# Patient Record
Sex: Male | Born: 1990
Health system: Southern US, Community
[De-identification: ages and names within clinical notes are randomized; demographics above are authoritative.]

## PROBLEM LIST (undated history)

## (undated) DIAGNOSIS — L309 Dermatitis, unspecified: Secondary | ICD-10-CM

## (undated) DIAGNOSIS — J45909 Unspecified asthma, uncomplicated: Secondary | ICD-10-CM

---

## 2014-03-22 ENCOUNTER — Emergency Department (HOSPITAL_BASED_OUTPATIENT_CLINIC_OR_DEPARTMENT_OTHER)
Admission: EM | Admit: 2014-03-22 | Discharge: 2014-03-22 | Disposition: A | Discharge status: Home / Self Care | Payer: Managed Care, Other (non HMO) | Attending: Emergency Medicine | Admitting: Emergency Medicine

## 2014-03-22 ENCOUNTER — Encounter (HOSPITAL_BASED_OUTPATIENT_CLINIC_OR_DEPARTMENT_OTHER): Payer: Self-pay

## 2014-03-22 DIAGNOSIS — K029 Dental caries, unspecified: Secondary | ICD-10-CM | POA: Insufficient documentation

## 2014-03-22 DIAGNOSIS — Z720 Tobacco use: Secondary | ICD-10-CM | POA: Diagnosis not present

## 2014-03-22 DIAGNOSIS — K088 Other specified disorders of teeth and supporting structures: Secondary | ICD-10-CM | POA: Diagnosis present

## 2014-03-22 DIAGNOSIS — J45909 Unspecified asthma, uncomplicated: Secondary | ICD-10-CM | POA: Diagnosis not present

## 2014-03-22 HISTORY — DX: Unspecified asthma, uncomplicated: J45.909

## 2014-03-22 MED ORDER — IBUPROFEN 800 MG PO TABS: 800.0000 mg | ORAL_TABLET | Freq: Three times a day (TID) | ORAL | Status: DC | PRN

## 2014-03-22 MED ORDER — PENICILLIN V POTASSIUM 250 MG PO TABS
500.0000 mg | ORAL_TABLET | Freq: Once | ORAL | Status: AC
  Administered 2014-03-22: 500 mg via ORAL
  Filled 2014-03-22: qty 2

## 2014-03-22 MED ORDER — HYDROCODONE-ACETAMINOPHEN 5-325 MG PO TABS: 1.0000 | ORAL_TABLET | Freq: Four times a day (QID) | ORAL | Status: DC | PRN

## 2014-03-22 MED ORDER — IBUPROFEN 800 MG PO TABS
800.0000 mg | ORAL_TABLET | Freq: Once | ORAL | Status: AC
  Administered 2014-03-22: 800 mg via ORAL
  Filled 2014-03-22: qty 1

## 2014-03-22 MED ORDER — PENICILLIN V POTASSIUM 500 MG PO TABS: 500.0000 mg | ORAL_TABLET | Freq: Four times a day (QID) | ORAL | Status: DC

## 2014-03-22 MED ORDER — HYDROCODONE-ACETAMINOPHEN 5-325 MG PO TABS: 2.0000 | ORAL_TABLET | Freq: Once | ORAL | Status: DC

## 2014-03-22 NOTE — ED Provider Notes (Signed)
CSN: [id number]     Arrival date & time 03/22/14  1238 History   First MD Initiated Contact with Patient 03/22/14 1300     Chief Complaint  Patient presents with  . Dental Pain     (Consider location/radiation/quality/duration/timing/severity/associated sxs/prior Treatment) Patient is a 24 y.o. male presenting with tooth pain. The history is provided by the patient.  Dental Pain Location:  Upper Upper teeth location:  15/LU 2nd molar Quality:  Aching Severity:  Moderate Onset quality:  Gradual Timing:  Constant Progression:  Worsening Chronicity:  Chronic Context: dental fracture   Associated symptoms: no fever     Past Medical History  Diagnosis Date  . Asthma    History reviewed. No pertinent past surgical history. No family history on file. History  Substance Use Topics  . Smoking status: Current Every Day Smoker  . Smokeless tobacco: Not on file  . Alcohol Use: Yes    Review of Systems  Constitutional: Negative for fever and chills.  Respiratory: Negative for cough and shortness of breath.   All other systems reviewed and are negative.     Allergies  Review of patient's allergies indicates no known allergies.  Home Medications   Prior to Admission medications   Not on File   BP 162/102 mmHg  Pulse 75  Temp(Src) 97.8 F (36.6 C) (Oral)  Resp 18  Ht 6\' 1"  (1.854 m)  Wt 205 lb (92.987 kg)  BMI 27.05 kg/m2  SpO2 99% Physical Exam  Constitutional: He is oriented to person, place, and time. He appears well-developed and well-nourished. No distress.  HENT:  Head: Normocephalic and atraumatic.  Mouth/Throat: Oropharynx is clear and moist. No oropharyngeal exudate.    Eyes: EOM are normal. Pupils are equal, round, and reactive to light.  Neck: Normal range of motion. Neck supple.  Cardiovascular: Normal rate and regular rhythm.  Exam reveals no friction rub.   No murmur heard. Pulmonary/Chest: Effort normal and breath sounds normal. No respiratory  distress. He has no wheezes. He has no rales.  Abdominal: Soft. He exhibits no distension. There is no tenderness. There is no rebound.  Musculoskeletal: Normal range of motion. He exhibits no edema.  Neurological: He is alert and oriented to person, place, and time.  Skin: No rash noted. He is not diaphoretic.  Nursing note and vitals reviewed.   ED Course  Procedures (including critical care time) Labs Review Labs Reviewed - No data to display  Imaging Review No results found.   EKG Interpretation None      MDM   Final diagnoses:  Pain due to dental caries    34M here with dental problem. Hx of dental fractures and was seeing a dentist, however he didn't f/u when dentist recommended a root canal. On exam, airway patent, no facial swelling. R upper posterior molar with a fracture. L upper posterior molar with almost entire tooth fractured off. Exquisitely tender to touch. No intraoral abscess. Patient refused dental block. Given motrin, pain meds, penicillin, dental referral.    Elwin MochaBlair Franz Svec, MD 03/22/14 1334

## 2014-03-22 NOTE — ED Notes (Signed)
Pt unable to produce driver. Vicodin withheld. Pt verbalized understanding.

## 2014-03-22 NOTE — ED Notes (Signed)
Pt advised he will be given vicodin and discharged when his ride arrives.

## 2014-03-22 NOTE — ED Notes (Signed)
C/o bilat upper toothaches-"bump" left upper gum

## 2014-03-22 NOTE — Discharge Instructions (Signed)
Dental Pain °A tooth ache may be caused by cavities (tooth decay). Cavities expose the nerve of the tooth to air and hot or cold temperatures. It may come from an infection or abscess (also called a boil or furuncle) around your tooth. It is also often caused by dental caries (tooth decay). This causes the pain you are having. °DIAGNOSIS  °Your caregiver can diagnose this problem by exam. °TREATMENT  °· If caused by an infection, it may be treated with medications which kill germs (antibiotics) and pain medications as prescribed by your caregiver. Take medications as directed. °· Only take over-the-counter or prescription medicines for pain, discomfort, or fever as directed by your caregiver. °· Whether the tooth ache today is caused by infection or dental disease, you should see your dentist as soon as possible for further care. °SEEK MEDICAL CARE IF: °The exam and treatment you received today has been provided on an emergency basis only. This is not a substitute for complete medical or dental care. If your problem worsens or new problems (symptoms) appear, and you are unable to meet with your dentist, call or return to this location. °SEEK IMMEDIATE MEDICAL CARE IF:  °· You have a fever. °· You develop redness and swelling of your face, jaw, or neck. °· You are unable to open your mouth. °· You have severe pain uncontrolled by pain medicine. °MAKE SURE YOU:  °· Understand these instructions. °· Will watch your condition. °· Will get help right away if you are not doing well or get worse. °Document Released: 01/20/2005 Document Revised: 04/14/2011 Document Reviewed: 09/08/2007 °ExitCare® Patient Information ©2015 [employer]. This information is not intended to replace advice given to you by your health care provider. Make sure you discuss any questions you have with your health care provider. ° °Emergency Department Resource Guide °1) Find a Doctor and Pay Out of Pocket °Although you won't have to find out who  is covered by your insurance plan, it is a good idea to ask around and get recommendations. You will then need to call the office and see if the doctor you have chosen will accept you as a new patient and what types of options they offer for patients who are self-pay. Some doctors offer discounts or will set up payment plans for their patients who do not have insurance, but you will need to ask so you aren't surprised when you get to your appointment. ° °2) Contact Your Local Health Department °Not all health departments have doctors that can see patients for sick visits, but many do, so it is worth a call to see if yours does. If you don't know where your local health department is, you can check in your phone book. The CDC also has a tool to help you locate your state's health department, and many state websites also have listings of all of their local health departments. ° °3) Find a Walk-in Clinic °If your illness is not likely to be very severe or complicated, you may want to try a walk in clinic. These are popping up all over the country in pharmacies, drugstores, and shopping centers. They're usually staffed by nurse practitioners or physician assistants that have been trained to treat common illnesses and complaints. They're usually fairly quick and inexpensive. However, if you have serious medical issues or chronic medical problems, these are probably not your best option. ° °No Primary Care Doctor: °- Call [facility] at  832-8000 - they can help you locate a primary   care doctor that  accepts your insurance, provides certain services, etc. °- Physician Referral Service- 1-800-533-3463 ° °Chronic Pain Problems: °Organization         Address  Phone   Notes  °[facility]  (336) 297-2271 Patients need to be referred by their primary care doctor.  ° °Medication Assistance: °Organization         Address  Phone   Notes  °[place] [facility] [address]  [address] °[place], [place] [address] (336) 641-8030 --Must be a resident of [place] °-- Must have NO insurance coverage whatsoever (no Medicaid/ Medicare, etc.) °-- The pt. MUST have a primary care doctor that directs their care regularly and follows them in the community °  °[facility]  (866) 331-1348   °[employer]  (888) 892-1162   ° °Agencies that provide inexpensive medical care: °Organization         Address  Phone   Notes  °[facility]  (336) 832-8035   °[facility]    (336) 832-7272   °[facility] [address] °[place], [place] [address] (336) 832-4777   °[facility] [address], °[place] (336) 271-4999   °[facility]    (336) 373-0678   °[facility]    (336) 272-1050   °[facility] ° [address], [place] Phone:  (336) 832-4444, Fax:  (336) 832-4440 Hours of Operation:  9 am - 6 pm, M-F.  Also accepts Medicaid/Medicare and self-pay.  °[facility] ° [address], [place] Phone: (336) 832-3150, Fax: (336) 832-3151. Hours of Operation:  8:30 am - 5:30 pm, M-F.  Also accepts Medicaid and self-pay.  °[facility] [place] [address], [place] Phone: (336) 878-6027   °[facility] [address], [place], [place] (336)723-1848, Ext. 123 Mondays & Thursdays: 7-9 AM.  First 15 patients are seen on a first come, first serve basis. °  ° °Medicaid-accepting [place] Providers: ° °Organization         Address  Phone   Notes  °[facility] [address], [place] (336) 641-2100 Also accepts self-pay patients.  °[facility] [address], [place] ° (336) 856-9996   °[facility] [address], [place] (336) 288-8857   °[facility] [address] [place] [address], [place] (336) 299-7000   °Veita Bland [address], [place]  ° (336) 373-1557 Only accepts [place] Access Medicaid patients after they have their name applied to their card.  ° °Self-Pay (no insurance) in [place]: ° °Organization         Address  Phone   Notes  °Sickle Cell Patients, [facility] [address], [place] (336) 832-1970   °[facility] [address], [place] (336) 832-4400   °[facility] ° [address], [place] (336) 992-4800   °[facility]/Dr. Osei-Bonsu ° [address] [place] [address], [place] or [address], [place] (336) 841-8500 Phone number for both [place] and [place] locations is the same.  °[facility] [address], [place] (336) 299-0000   °[facility] [address] [place] [address],  or [address] (336) 852-7530 °(336) 878-2260   °  [facility] [address], [place] (336) 350-1642, phone; (336) 294-5005, fax Sees patients 1st and 3rd Saturday of every month.  Must not qualify for public or private insurance (i.e. Medicaid, Medicare, [place] Health Choice, Veterans' Benefits) • Household income should be no more than 200% of the poverty level •The clinic cannot treat you if you are pregnant or think you are pregnant • Sexually transmitted diseases are not treated at the clinic.  ° ° °Dental Care: °Organization         Address  Phone  Notes  °[facility] [address], [place] (336) 641-6152 Accepts children up to age 21 who are enrolled in Medicaid or [place] Health Choice; pregnant women with a Medicaid card; and children who have applied for Medicaid or [place] Health Choice, but were declined, whose parents can pay a reduced fee at time of service.  °[facility] [place]  [address], [place] (336) 641-7733 Accepts children up to age 21 who are  enrolled in Medicaid or [place] Health Choice; pregnant women with a Medicaid card; and children who have applied for Medicaid or [place] Health Choice, but were declined, whose parents can pay a reduced fee at time of service.  °[facility] ° [address], [place] (336) 641-4533 Patients are seen by appointment only. Walk-ins are not accepted. [facility] will see patients 18 years of age and older. °Monday - Tuesday (8am-5pm) °Most Wednesdays (8:30-5pm) °$30 per visit, cash only  °[facility] ° [address], [place] (336) 641-4533 Patients are seen by appointment only. Walk-ins are not accepted. [facility] will see patients 18 years of age and older. °One Wednesday Evening (Monthly: Volunteer Based).  $30 per visit, cash only  °[facility]  (919) 537-3737 for adults; Children under age 4, call Graduate Pediatric Dentistry at ([phone]) [phone]. Children aged 4-14, please call (919) [phone] to request a pediatric application. ° Dental services are provided in all areas of dental care including fillings, crowns and bridges, complete and partial dentures, implants, gum treatment, root canals, and extractions. Preventive care is also provided. Treatment is provided to both adults and children. °Patients are selected via a lottery and there is often a waiting list. °  °[facility] [address], °[place] ° (336) 763-8833 [email or web address] °  °[facility] [address], [place], [place] (336)723-1848, Ext. 123 Second and Fourth Thursday of each month, opens at 6:30 AM; Clinic ends at 9 AM.  Patients are seen on a first-come first-served basis, and a limited number are seen during each clinic.  ° °[facility] ° [address], [place], [place] (336) 723-7904   Eligibility Requirements °You must have lived in [place], [place], or [place] counties for at least the last three months. °  You  cannot be eligible for state or federal sponsored healthcare insurance, including Veterans Administration, Medicaid, or Medicare. °  You generally cannot be eligible for healthcare insurance through your employer.  °  How to apply: °Eligibility screenings are held every Tuesday and Wednesday afternoon from 1:00 pm until 4:00 pm. You do not need an appointment for the interview!  °[facility] [address], [place], [place] 336-631-2330   °[facility]  336-342-8273   °[facility]  336-703-3100   °[facility]  336-570-6415   ° °Behavioral Health Resources in the Community: °Intensive Outpatient Programs °Organization         Address  Phone  Notes  °[facility] [address], [place], [place] 336-878-6098   °[facility] [address], [place], [place] 336-832-9800   °[facility] [address], [place], [place] ° 336-882-2125   °[facility] [address],  °[place], [place] 1-800-853-5163 or 336-641-4981   °Substance Abuse Resources °Organization         Address  Phone  Notes  °[facility]  336-882-2125   °[facility]  336-784-9470   °[facility]  336-285-9073   °[facility]  336-845-3988   °Residential & Outpatient Substance Abuse Program  1-800-659-3381   °Psychological Services °Organization         Address  Phone  Notes  °[facility]  336- 832-9600   °[facility]  336- 378-7881   °[facility] [address], [place] 1-800-853-5163 or 336-641-4981   ° °Mobile Crisis Teams °Organization         Address  Phone  Notes  °[facility], Mobile Crisis Care Unit  1-877-626-1772   °Assertive °[facility] ° [address] [place], [place] 336-834-9664   °Sharon DeEsch [address] °[place] 336-554-5454   ° °Self-Help/Support  Groups °Organization         Address  Phone             Notes  °[facility] - variety of support groups  336- 373-1402 Call for more information  °Narcotics Anonymous (NA), [facility] [address], °[place]  2 meetings at this location  ° °Residential Treatment Programs °Organization         Address  Phone  Notes  °[facility] [address],    °[place]  1-866-801-8205   °[facility] ° [address], [place], [place] 704-293-8524   °[facility] [address], [place] 336-845-3988 Admissions: 8am-3pm M-F  °[facility] [address]    °[place], [place] 336-841-1104   °[facility] [address], [place], [place] 336-379-7146   °[facility] [address]  °[place], [place] 336-285-9073   °[facility] - Intensive Outpatient [address], [place], [place] 336-852-3033   °[facility] [address]  °[place], [place] 1-877-615-2722 or 336-784-9470   °[facility] [address] [place], [place] 336-227-7417 Accepts Medicaid  °[facility] [address]  ° [place] 1-800-659-3381 Substance Abuse/Addiction Treatment  ° °[place] Behavioral Health Resources °Organization         Address  Phone  Notes  °[facility]  (888) 581-9988   °Julie Brannon, PhD [address] [place], [place]   (336) 349-5553 or (336) 951-0000   °[facility]   [address] °[place], [place] (336) 349-4454   °[facility] [address], [place],  (336) 342-8316 Insurance/Medicaid/sponsorship through [facility]  °[facility] [address]                                    [place],  (336) 342-8316 Therapy/tele-psych/case  °[facility]   [address]  ° [place], [place] (336) 349-2233    °Dr. Arfeen  (336) 349-4544   °[facility] [place]  [place]  [employer] [place] [facility] 1) [address], [place] °2) [address], [place] °3)  [address], [place] (336) 349-3220 °(336) 342-7768 ° °(336) 342-8140   °[place] [facility] (336) 342-1394 or (336) 342-3537 (After Hours)    ° ° ° °

## 2014-06-20 ENCOUNTER — Encounter (HOSPITAL_BASED_OUTPATIENT_CLINIC_OR_DEPARTMENT_OTHER): Payer: Self-pay | Admitting: *Deleted

## 2014-06-20 ENCOUNTER — Emergency Department (HOSPITAL_BASED_OUTPATIENT_CLINIC_OR_DEPARTMENT_OTHER)
Admission: EM | Admit: 2014-06-20 | Discharge: 2014-06-20 | Disposition: A | Discharge status: Home / Self Care | Payer: BLUE CROSS/BLUE SHIELD | Attending: Emergency Medicine | Admitting: Emergency Medicine

## 2014-06-20 DIAGNOSIS — J45909 Unspecified asthma, uncomplicated: Secondary | ICD-10-CM | POA: Insufficient documentation

## 2014-06-20 DIAGNOSIS — Z720 Tobacco use: Secondary | ICD-10-CM | POA: Diagnosis not present

## 2014-06-20 DIAGNOSIS — K0889 Other specified disorders of teeth and supporting structures: Secondary | ICD-10-CM

## 2014-06-20 DIAGNOSIS — K088 Other specified disorders of teeth and supporting structures: Secondary | ICD-10-CM | POA: Diagnosis present

## 2014-06-20 MED ORDER — NAPROXEN 375 MG PO TABS: 375.0000 mg | ORAL_TABLET | Freq: Two times a day (BID) | ORAL | Status: DC

## 2014-06-20 MED ORDER — AMOXICILLIN 500 MG PO CAPS: 500.0000 mg | ORAL_CAPSULE | Freq: Three times a day (TID) | ORAL | Status: DC

## 2014-06-20 NOTE — Discharge Instructions (Signed)
Please call your doctor for a followup appointment within 24-48 hours. When you talk to your doctor please let them know that you were seen in the emergency department and have them acquire all of your records so that they can discuss the findings with you and formulate a treatment plan to fully care for your new and ongoing problems. Please follow-up to primary care provider Please follow-up with your dentist Please take antibiotics as prescribed Please apply cool compressions for soothing purposes Please continue to monitor symptoms closely and if symptoms are to worsen or change (fever greater than 101, chills, sweating, nausea, vomiting, chest pain, shortness of breathe, difficulty breathing, weakness, numbness, tingling, worsening or changes to pain pattern, facial swelling, neck swelling, inability to swallow, bleeding, pus drainage) please report back to the Emergency Department immediately.    Dental Pain Toothache is pain in or around a tooth. It may get worse with chewing or with cold or heat.  HOME CARE  Your dentist may use a numbing medicine during treatment. If so, you may need to avoid eating until the medicine wears off. Ask your dentist about this.  Only take medicine as told by your dentist or doctor.  Avoid chewing food near the painful tooth until after all treatment is done. Ask your dentist about this. GET HELP RIGHT AWAY IF:   The problem gets worse or new problems appear.  You have a fever.  There is redness and puffiness (swelling) of the face, jaw, or neck.  You cannot open your mouth.  There is pain in the jaw.  There is very bad pain that is not helped by medicine. MAKE SURE YOU:   Understand these instructions.  Will watch your condition.  Will get help right away if you are not doing well or get worse. Document Released: 07/09/2007 Document Revised: 04/14/2011 Document Reviewed: 07/09/2007 [facility] Patient Information 2015 [place], [place]. This  information is not intended to replace advice given to you by your health care provider. Make sure you discuss any questions you have with your health care provider.  Dental Care and Dentist Visits Dental care supports good overall health. Regular dental visits can also help you avoid dental pain, bleeding, infection, and other more serious health problems in the future. It is important to keep the mouth healthy because diseases in the teeth, gums, and other oral tissues can spread to other areas of the body. Some problems, such as diabetes, heart disease, and pre-term labor have been associated with poor oral health.  See your dentist every 6 months. If you experience emergency problems such as a toothache or broken tooth, go to the dentist right away. If you see your dentist regularly, you may catch problems early. It is easier to be treated for problems in the early stages.  WHAT TO EXPECT AT A DENTIST VISIT  Your dentist will look for many common oral health problems and recommend proper treatment. At your regular dental visit, you can expect:  Gentle cleaning of the teeth and gums. This includes scraping and polishing. This helps to remove the sticky substance around the teeth and gums (plaque). Plaque forms in the mouth shortly after eating. Over time, plaque hardens on the teeth as tartar. If tartar is not removed regularly, it can cause problems. Cleaning also helps remove stains.  Periodic X-rays. These pictures of the teeth and supporting bone will help your dentist assess the health of your teeth.  Periodic fluoride treatments. Fluoride is a natural mineral shown to  help strengthen teeth. Fluoride treatmentinvolves applying a fluoride gel or varnish to the teeth. It is most commonly done in children.  Examination of the mouth, tongue, jaws, teeth, and gums to look for any oral health problems, such as:  Cavities (dental caries). This is decay on the tooth caused by plaque, sugar, and acid  in the mouth. It is best to catch a cavity when it is small.  Inflammation of the gums caused by plaque buildup (gingivitis).  Problems with the mouth or malformed or misaligned teeth.  Oral cancer or other diseases of the soft tissues or jaws. KEEP YOUR TEETH AND GUMS HEALTHY For healthy teeth and gums, follow these general guidelines as well as your dentist's specific advice:  Have your teeth professionally cleaned at the dentist every 6 months.  Brush twice daily with a fluoride toothpaste.  Floss your teeth daily.  Ask your dentist if you need fluoride supplements, treatments, or fluoride toothpaste.  Eat a healthy diet. Reduce foods and drinks with added sugar.  Avoid smoking. TREATMENT FOR ORAL HEALTH PROBLEMS If you have oral health problems, treatment varies depending on the conditions present in your teeth and gums.  Your caregiver will most likely recommend good oral hygiene at each visit.  For cavities, gingivitis, or other oral health disease, your caregiver will perform a procedure to treat the problem. This is typically done at a separate appointment. Sometimes your caregiver will refer you to another dental specialist for specific tooth problems or for surgery. SEEK IMMEDIATE DENTAL CARE IF:  You have pain, bleeding, or soreness in the gum, tooth, jaw, or mouth area.  A permanent tooth becomes loose or separated from the gum socket.  You experience a blow or injury to the mouth or jaw area. Document Released: 10/02/2010 Document Revised: 04/14/2011 Document Reviewed: 10/02/2010 [facility]Exit[facility] Patient Information 2015 [place]ExitCare, [place]LLC. This information is not intended to replace advice given to you by your health care provider. Make sure you discuss any questions you have with your health care provider.   Emergency Department Resource Guide 1) Find a Doctor and Pay Out of Pocket Although you won't have to find out who is covered by your insurance plan, it is a good  idea to ask around and get recommendations. You will then need to call the office and see if the doctor you have chosen will accept you as a new patient and what types of options they offer for patients who are self-pay. Some doctors offer discounts or will set up payment plans for their patients who do not have insurance, but you will need to ask so you aren't surprised when you get to your appointment.  2) Contact Your Local Health Department Not all health departments have doctors that can see patients for sick visits, but many do, so it is worth a call to see if yours does. If you don't know where your local health department is, you can check in your phone book. The CDC also has a tool to help you locate your state's health department, and many state websites also have listings of all of their local health departments.  3) Find a Walk-in Clinic If your illness is not likely to be very severe or complicated, you may want to try a walk in clinic. These are popping up all over the country in pharmacies, drugstores, and shopping centers. They're usually staffed by nurse practitioners or physician assistants that have been trained to treat common illnesses and complaints. They're usually fairly quick  and inexpensive. However, if you have serious medical issues or chronic medical problems, these are probably not your best option.  No Primary Care Doctor: - Call Health Connect at  [phone] - they can help you locate a primary care doctor that  accepts your insurance, provides certain services, etc. - Physician Referral Service- [phone]  Chronic Pain Problems: Organization         Address  Phone   Notes  [facility]  [phone] Patients need to be referred by their primary care doctor.   Medication Assistance: Organization         Address  Phone   Notes  [facility] Medication [facility] [address] [place], [place] [address] [phone]  --Must be a resident of [facility] -- Must have NO insurance coverage whatsoever (no Medicaid/ Medicare, etc.) -- The pt. MUST have a primary care doctor that directs their care regularly and follows them in the community   [facility]ist  [phone]   [employer]  [phone]    Agencies that provide inexpensive medical care: Organization         Address  Phone   Notes  [facility]  [phone]   [facility]    [phone]   [facility] [address] [place], [place] [address] [phone]   [facility] [address] [place], [place] [phone]   Planned Parenthood    [phone]   [facility]    [phone]   [facility] and [facility]  [address], [place] Phone:  [phone], Fax:  [phone] Hours of Operation:  9 am - 6 pm, M-F.  Also accepts Medicaid/Medicare and self-pay.  [facility]  [address], [place] Phone: [phone], Fax: [phone]. Hours of Operation:  8:30 am - 5:30 pm, M-F.  Also accepts Medicaid and self-pay.  [facility] [place] [address] [place] Phone: [phone]   [facility] [address] Natasha Bence [place], [place] [phone], Ext. [phone] Mondays & Thursdays: 7-9 AM.  First 15 patients are seen on a first come, first serve basis.    Medicaid-accepting [facility] Providers:  Organization         Address  Phone   Notes  [facility] [address], [place] [phone] Also accepts self-pay patients.  [facility] [place] [address] Laurell Josephs [place], [place]  [phone]   [facility] [address], [place] [phone]   [facility] [address] [place] [phone]   Renaye Rakers [address], [place]   [phone] Only  accepts [place] Access [place] patients after they have their name applied to their card.   Self-Pay (no insurance) in [facility]:  Organization         Address  Phone   Notes  Sickle Cell Patients, [facility] [address] [place], [place] [phone]   [facility] [address] [place], [place] [phone]   [facility] [place]  [address], [place] [phone]   [facility]/Dr. Osei-Bonsu  [address], [place] or [address], [place] [phone] Phone number for  both [place] and [place] locations is the same.  [facility] and [facility] [address] [place] [phone]   [facility] [address] [place] or [address] [phone]   [facility] [address], [place] [phone], phone; [phone], fax Sees patients 1st and 3rd Saturday of every month.  Must not qualify for public or private insurance (i.e. Medicaid, Medicare, [place] Health Choice, Veterans' Benefits)  Household income should be no more than 200% of the poverty level The clinic cannot treat you if you are pregnant or think you are pregnant  Sexually transmitted diseases are not treated at the clinic.    Dental Care: Organization         Address  Phone  Notes  [facility] Department of [facility] [address] [place], [place] [phone] Accepts children up to age 34 who are enrolled in [place] or [place] Health Choice; pregnant women with a Medicaid card; and children who have applied for Medicaid or [place] Health Choice, but were declined, whose parents can pay a reduced fee at time of service.  [facility]  [address], [place] [phone] Accepts children up to age 33 who are enrolled in [place] or [place] Health Choice; pregnant  women with a Medicaid card; and children who have applied for Medicaid or [place] Health Choice, but were declined, whose parents can pay a reduced fee at time of service.  [facility]  [address] [place], [place] [phone] Patients are seen by appointment only. Walk-ins are not accepted. [facility] will see patients 35 years of age and older. Monday - Tuesday (8am-5pm) Most Wednesdays (8:30-5pm) $30 per visit, cash only  [facility]  [address], [facility] [phone] Patients are seen by appointment only. Walk-ins are not accepted. [facility] will see patients 28 years of age and older. One Wednesday Evening (Monthly: Volunteer Based).  $30 per visit, cash only  [employer]  [phone] for adults; Children under age 60, call Graduate Pediatric Dentistry at [phone]. Children aged 62-14, please call [phone] to request a pediatric application.  Dental services are provided in all areas of dental care including fillings, crowns and bridges, complete and partial dentures, implants, gum treatment, root canals, and extractions. Preventive care is also provided. Treatment is provided to both adults and children. Patients are selected via a lottery and there is often a waiting list.   [facility] [address], [place]  [phone] [email or web address]   [facility] [address] [place], [place] [phone], Ext. [phone] Second and Fourth Thursday of each month, opens at 6:30 AM; Clinic ends at 9 AM.  Patients are seen on a first-come first-served basis, and a limited number are seen during each clinic.   [facility]  [address] Ether Griffins [place], [place] [phone]   Eligibility Requirements You must have lived in [place], [place], or [place] counties for at least the last three months.   You cannot be eligible for state or federal sponsored  [place], including [employer], [place], or [employer].   You generally cannot be eligible for healthcare insurance through your employer.    How to apply: Eligibility screenings are held every Tuesday and Wednesday afternoon from 1:00 pm until 4:00  pm. You do not need an appointment for the interview!  [facility] [address], [place], [place] [phone]   [facility] Health Department  [phone]   [facility] Health Department  [phone]   [facility] Health Department  [phone]    Behavioral Health Resources in the Community: Intensive Outpatient Programs Organization         Address  Phone  Notes  [facility] [address] [place], [place] [phone]   [facility] [address] [place], [place] [phone]   [facility] [address] [place], [place]  [phone]   [facility] Mental Health [address]  [place], [place] [phone] or [phone]   Substance Abuse Resources Organization         Address  Phone  Notes  [facility] Drug Services  [phone]   [facility]  [phone]   [facility] [place]  [phone]   Floydene Flock  [phone]   Residential & Outpatient Substance Abuse Program  [phone]   Psychological Services Organization         Address  Phone  Notes  [facility] Behavior[facility] Health  [phone]   [facility]  [phone]   [facility] Mental Health [address], [place] [phone] or [phone]    Mobile Crisis Teams Organization         Address  Phone  Notes  [facility], Mobile Crisis Care Unit  [phone]   [facility]  [address] [place], [place] [phone]   Doristine Locks [address] [place] [phone]    Self-Help/Support Groups Organization         Address  Phone              Notes  [facility] [place] - variety of support groups  336- I7437963 Call for more information  Narcotics Anonymous (NA), [facility] [address], [employer] [place]  2 meetings at this location   Statistician         Address  Phone  Notes  [facility] [address],    [place]  [phone]   [facility]  [address] [place] [address], [place], [place] [phone]   [facility] [address] [place], [place] [phone] Admissions: 8am-3pm M-F  [facility] [address],    [place], [place] [phone]   [facility] [address] [place], [place], [place] [phone]   [facility] [address],  [place], [place] [phone]   [facility] - Intensive Outpatient [address] Laurell Joseph[address], [place], [place] [phone]   [facility] ([facility] [address] [place]  [place], [place] [phone] or [phone]   [facility] ([facility]) [address], [place], [place] [phone] Accepts Medicaid  [facility] [address],  [place] [phone] Substance Abuse/Addiction Treatment   [facility] Organization         Address  Phone  Notes  [facility]  [phone]   Angie Fava, PhD [address] Ervin Knack [place], [place]   7142426942 or [phone]   [facility]   [address] [place], [place] [phone]   [facility] [address] [place], [place] [phone] Insurance/Medicaid/sponsorship through [employer] and Families [address]  [place], [place] [phone] Therapy/tele-psych/case  [facility] [address]   [place], [place] [phone]    Dr. Lolly Mustache  [phone]   [facility]  [employer] [facility] Dept. 1) [address], [place] 2) [address] [place] 3)  [address], [place] [phone]  [phone]   [facility] Child Abuse Hotline 385-429-6336 or [phone] (After Hours)

## 2014-06-20 NOTE — ED Provider Notes (Signed)
CSN: [id number]     Arrival date & time 06/20/14  2033 History   First MD Initiated Contact with Patient 06/20/14 2136     Chief Complaint  Patient presents with  . Dental Pain     (Consider location/radiation/quality/duration/timing/severity/associated sxs/prior Treatment) The history is provided by the patient. No language interpreter was used.  Brian Lopez is a 24 year old male with past history of asthma presenting to the ED with dental pain that has been ongoing for approximately 2 months. Patient reported that he had a tooth extraction approximately 2 months ago to the left maxillary jawline. Reported that a month ago he was seen for a dry socket that was stitched over. Patient reports he's been experiencing dental pain to that area where the tooth was extracted. Reported a proxy 2 weeks ago he started to experience right upper dental pain, reports that his tooth is diagrammed in currently in decaying process. States disease due to get his tooth extracted. Reported that he called the dentist and has an appointment in late June. Reported that he's been using ibuprofen 800 mg without relief. Denied pus drainage, bleeding, facial swelling, neck pain, neck swelling, change the color, fever, chills, blurred vision, sudden loss of vision, difficulty swallowing. PCP none  Past Medical History  Diagnosis Date  . Asthma    History reviewed. No pertinent past surgical history. History reviewed. No pertinent family history. History  Substance Use Topics  . Smoking status: Current Every Day Smoker  . Smokeless tobacco: Not on file  . Alcohol Use: Yes    Review of Systems  Constitutional: Negative for fever and chills.  HENT: Positive for dental problem.   Eyes: Negative for visual disturbance.  Respiratory: Negative for chest tightness and shortness of breath.   Cardiovascular: Negative for chest pain.  Musculoskeletal: Negative for neck pain and neck stiffness.  Neurological:  Negative for weakness.      Allergies  Review of patient's allergies indicates no known allergies.  Home Medications   Prior to Admission medications   Medication Sig Start Date End Date Taking? Authorizing Provider  amoxicillin (AMOXIL) 500 MG capsule Take 1 capsule (500 mg total) by mouth 3 (three) times daily. 06/20/14   Azia Toutant, PA-C   BP 152/79 mmHg  Pulse 72  Temp(Src) 98.6 F (37 C) (Oral)  Resp 16  SpO2 100% Physical Exam  Constitutional: He is oriented to person, place, and time. He appears well-developed and well-nourished. No distress.  HENT:  Head: Normocephalic and atraumatic.  Mouth/Throat: Oropharynx is clear and moist. No oropharyngeal exudate.  Negative facial swelling. Extraction of the left second premolar of the maxillary jawline identified with negative drainage or bleeding. Negative dry socket open wound noted. Negative palpation of abscess or areas of induration. Tenderness upon palpation to the area of distraction. Negative findings of acute infection. Diagrammed right first molar of the maxillary jawline identified with negative active drainage or bleeding noted. Tenderness upon palpation. Negative areas of fluctuance or induration. Uvula midline with symmetrical elevation. Negative uvula swelling. Negative trismus. Negative sublingual lesions.  Eyes: Conjunctivae and EOM are normal. Pupils are equal, round, and reactive to light. Right eye exhibits no discharge. Left eye exhibits no discharge.  Neck: Normal range of motion. Neck supple. No tracheal deviation present.  Cardiovascular: Normal rate, regular rhythm and normal heart sounds.  Exam reveals no friction rub.   No murmur heard. Pulses:      Radial pulses are 2+ on the right side, and 2+ on the  left side.  Pulmonary/Chest: Effort normal and breath sounds normal. No respiratory distress. He has no wheezes. He has no rales.  Patient is able to speak in full sentences without  difficulty Negative use of accessory muscles Negative stridor  Musculoskeletal: Normal range of motion.  Lymphadenopathy:    He has no cervical adenopathy.  Neurological: He is alert and oriented to person, place, and time. No cranial nerve deficit. He exhibits normal muscle tone. Coordination normal.  Skin: Skin is warm and dry. No rash noted. He is not diaphoretic. No erythema.  Psychiatric: He has a normal mood and affect. His behavior is normal. Thought content normal.  Nursing note and vitals reviewed.   ED Course  Procedures (including critical care time) Labs Review Labs Reviewed - No data to display  Imaging Review No results found.   EKG Interpretation None      MDM   Final diagnoses:  Pain, dental    Medications - No data to display  Filed Vitals:   06/20/14 2037  BP: 152/79  Pulse: 72  Temp: 98.6 F (37 C)  TempSrc: Oral  Resp: 16  SpO2: 100%   Negative signs of infection noted to the area of extraction. Diagrammed first premolar of the right maxillary jawline identified. Negative palpation of acute infection. Negative appreciable abscess noted at this time. Doubt peritonsillar abscess. Doubt retropharyngeal abscess. Doubt Ludwig's angina. Patient stable, afebrile. Patient not septic appearing. Negative signs of respiratory distress. Discharged patient. Discharge patient with antibiotics. [facility] Patient seen and assessed by attending physician, Dr. Alric RanS. Goldston - agreed to discharge. Discussed with patient to closely monitor symptoms and if symptoms are to worsen or change to report back to the ED - strict return instructions given.  Patient agreed to plan of care, understood, all questions answered.   Raymon MuttonMarissa Eva Griffo, PA-C 06/20/14 [phone]  Pricilla LovelessScott Goldston, MD 06/21/14 [phone]

## 2014-06-20 NOTE — ED Notes (Signed)
Pt c/o dental pain x 1 month 

## 2015-01-12 ENCOUNTER — Emergency Department (HOSPITAL_BASED_OUTPATIENT_CLINIC_OR_DEPARTMENT_OTHER)
Admission: EM | Admit: 2015-01-12 | Discharge: 2015-01-12 | Disposition: A | Discharge status: Home / Self Care | Payer: Managed Care, Other (non HMO) | Attending: Emergency Medicine | Admitting: Emergency Medicine

## 2015-01-12 ENCOUNTER — Encounter (HOSPITAL_BASED_OUTPATIENT_CLINIC_OR_DEPARTMENT_OTHER): Payer: Self-pay

## 2015-01-12 ENCOUNTER — Emergency Department (HOSPITAL_BASED_OUTPATIENT_CLINIC_OR_DEPARTMENT_OTHER): Payer: Managed Care, Other (non HMO)

## 2015-01-12 DIAGNOSIS — Z792 Long term (current) use of antibiotics: Secondary | ICD-10-CM | POA: Insufficient documentation

## 2015-01-12 DIAGNOSIS — M25461 Effusion, right knee: Secondary | ICD-10-CM | POA: Diagnosis not present

## 2015-01-12 DIAGNOSIS — Z791 Long term (current) use of non-steroidal anti-inflammatories (NSAID): Secondary | ICD-10-CM | POA: Insufficient documentation

## 2015-01-12 DIAGNOSIS — F17200 Nicotine dependence, unspecified, uncomplicated: Secondary | ICD-10-CM | POA: Diagnosis not present

## 2015-01-12 DIAGNOSIS — J45909 Unspecified asthma, uncomplicated: Secondary | ICD-10-CM | POA: Insufficient documentation

## 2015-01-12 DIAGNOSIS — M25561 Pain in right knee: Secondary | ICD-10-CM | POA: Insufficient documentation

## 2015-01-12 MED ORDER — IBUPROFEN 800 MG PO TABS
800.0000 mg | ORAL_TABLET | Freq: Once | ORAL | Status: AC
  Administered 2015-01-12: 800 mg via ORAL
  Filled 2015-01-12: qty 1

## 2015-01-12 MED ORDER — IBUPROFEN 800 MG PO TABS: 800.0000 mg | ORAL_TABLET | Freq: Three times a day (TID) | ORAL | Status: AC

## 2015-01-12 NOTE — ED Notes (Signed)
Reports no known injury.  Complains of right knee pain and pain with bending or walking.

## 2015-01-12 NOTE — Discharge Instructions (Signed)
1. Medications: alternate naprosyn and tylenol for pain control, usual home medications 2. Treatment: rest, ice, elevate and use brace, drink plenty of fluids, gentle stretching 3. Follow Up: Please followup with orthopedics as directed or your PCP in 1 week if no improvement for discussion of your diagnoses and further evaluation after today's visit; if you do not have a primary care doctor use the resource guide provided to find one; Please return to the ER for worsening symptoms or other concerns    Cryotherapy Cryotherapy means treatment with cold. Ice or gel packs can be used to reduce both pain and swelling. Ice is the most helpful within the first 24 to 48 hours after an injury or flare-up from overusing a muscle or joint. Sprains, strains, spasms, burning pain, shooting pain, and aches can all be eased with ice. Ice can also be used when recovering from surgery. Ice is effective, has very few side effects, and is safe for most people to use. PRECAUTIONS  Ice is not a safe treatment option for people with:  Raynaud phenomenon. This is a condition affecting small blood vessels in the extremities. Exposure to cold may cause your problems to return.  Cold hypersensitivity. There are many forms of cold hypersensitivity, including:  Cold urticaria. Red, itchy hives appear on the skin when the tissues begin to warm after being iced.  Cold erythema. This is a red, itchy rash caused by exposure to cold.  Cold hemoglobinuria. Red blood cells break down when the tissues begin to warm after being iced. The hemoglobin that carry oxygen are passed into the urine because they cannot combine with blood proteins fast enough.  Numbness or altered sensitivity in the area being iced. If you have any of the following conditions, do not use ice until you have discussed cryotherapy with your caregiver:  Heart conditions, such as arrhythmia, angina, or chronic heart disease.  High blood  pressure.  Healing wounds or open skin in the area being iced.  Current infections.  Rheumatoid arthritis.  Poor circulation.  Diabetes. Ice slows the blood flow in the region it is applied. This is beneficial when trying to stop inflamed tissues from spreading irritating chemicals to surrounding tissues. However, if you expose your skin to cold temperatures for too long or without the proper protection, you can damage your skin or nerves. Watch for signs of skin damage due to cold. HOME CARE INSTRUCTIONS Follow these tips to use ice and cold packs safely.  Place a dry or damp towel between the ice and skin. A damp towel will cool the skin more quickly, so you may need to shorten the time that the ice is used.  For a more rapid response, add gentle compression to the ice.  Ice for no more than 10 to 20 minutes at a time. The bonier the area you are icing, the less time it will take to get the benefits of ice.  Check your skin after 5 minutes to make sure there are no signs of a poor response to cold or skin damage.  Rest 20 minutes or more between uses.  Once your skin is numb, you can end your treatment. You can test numbness by very lightly touching your skin. The touch should be so light that you do not see the skin dimple from the pressure of your fingertip. When using ice, most people will feel these normal sensations in this order: cold, burning, aching, and numbness.  Do not use ice on someone who  cannot communicate their responses to pain, such as small children or people with dementia. HOW TO MAKE AN ICE PACK Ice packs are the most common way to use ice therapy. Other methods include ice massage, ice baths, and cryosprays. Muscle creams that cause a cold, tingly feeling do not offer the same benefits that ice offers and should not be used as a substitute unless recommended by your caregiver. To make an ice pack, do one of the following:  Place crushed ice or a bag of frozen  vegetables in a sealable plastic bag. Squeeze out the excess air. Place this bag inside another plastic bag. Slide the bag into a pillowcase or place a damp towel between your skin and the bag.  Mix 3 parts water with 1 part rubbing alcohol. Freeze the mixture in a sealable plastic bag. When you remove the mixture from the freezer, it will be slushy. Squeeze out the excess air. Place this bag inside another plastic bag. Slide the bag into a pillowcase or place a damp towel between your skin and the bag. SEEK MEDICAL CARE IF:  You develop white spots on your skin. This may give the skin a blotchy (mottled) appearance.  Your skin turns blue or pale.  Your skin becomes waxy or hard.  Your swelling gets worse. MAKE SURE YOU:   Understand these instructions.  Will watch your condition.  Will get help right away if you are not doing well or get worse.   This information is not intended to replace advice given to you by your health care provider. Make sure you discuss any questions you have with your health care provider.   Document Released: 09/16/2010 Document Revised: 02/10/2014 Document Reviewed: 09/16/2010 Elsevier Interactive Patient Education 2016 [employer].    How to Use a Knee Brace A knee brace is a device that you wear to support your knee, especially if the knee is healing after an injury or surgery. There are several types of knee braces. Some are designed to prevent an injury (prophylactic brace). These are often worn during sports. Others support an injured knee (functional brace) or keep it still while it heals (rehabilitative brace). People with severe arthritis of the knee may benefit from a brace that takes some pressure off the knee (unloader brace). Most knee braces are made from a combination of cloth and metal or plastic.  You may need to wear a knee brace to:  Relieve knee pain.  Help your knee support your weight (improve stability).  Help you walk farther  (improve mobility).  Prevent injury.  Support your knee while it heals from surgery or from an injury. RISKS AND COMPLICATIONS Generally, knee braces are very safe to wear. However, problems may occur, including:  Skin irritation that may lead to infection.  Making your condition worse if you wear the brace in the wrong way. HOW TO USE A KNEE BRACE Different braces will have different instructions for use. Your health care provider will tell you or show you:  How to put on your brace.  How to adjust the brace.  When and how often to wear the brace.  How to remove the brace.  If you will need any assistive devices in addition to the brace, such as crutches or a cane. In general, your brace should:  Have the hinge of the brace line up with the bend of your knee.  Have straps, hooks, or tapes that fasten snugly around your leg.  Not feel too tight  or too loose. HOW TO CARE FOR A KNEE BRACE  Check your brace often for signs of damage, such as loose connections or attachments. Your knee brace may get damaged or wear out during normal use.  Wash the fabric parts of your brace with soap and water.  Read the insert that comes with your brace for other specific care instructions. SEEK MEDICAL CARE IF:  Your knee brace is too loose or too tight and you cannot adjust it.  Your knee brace causes skin redness, swelling, bruising, or irritation.  Your knee brace is not helping.  Your knee brace is making your knee pain worse.   This information is not intended to replace advice given to you by your health care provider. Make sure you discuss any questions you have with your health care provider.   Document Released: 04/12/2003 Document Revised: 10/11/2014 Document Reviewed: 05/15/2014 Elsevier Interactive Patient Education 2016 [employer].   Emergency Department Resource Guide 1) Find a Doctor and Pay Out of Pocket Although you won't have to find out who is covered by  your insurance plan, it is a good idea to ask around and get recommendations. You will then need to call the office and see if the doctor you have chosen will accept you as a new patient and what types of options they offer for patients who are self-pay. Some doctors offer discounts or will set up payment plans for their patients who do not have insurance, but you will need to ask so you aren't surprised when you get to your appointment.  2) Contact Your Local Health Department Not all health departments have doctors that can see patients for sick visits, but many do, so it is worth a call to see if yours does. If you don't know where your local health department is, you can check in your phone book. The CDC also has a tool to help you locate your state's health department, and many state websites also have listings of all of their local health departments.  3) Find a Walk-in Clinic If your illness is not likely to be very severe or complicated, you may want to try a walk in clinic. These are popping up all over the country in pharmacies, drugstores, and shopping centers. They're usually staffed by nurse practitioners or physician assistants that have been trained to treat common illnesses and complaints. They're usually fairly quick and inexpensive. However, if you have serious medical issues or chronic medical problems, these are probably not your best option.  No Primary Care Doctor: - Call Health Connect at  [phone] - they can help you locate a primary care doctor that  accepts your insurance, provides certain services, etc. - Physician Referral Service- [phone]  Chronic Pain Problems: Organization         Address  Phone   Notes  [facility]  [phone] Patients need to be referred by their primary care doctor.   Medication Assistance: Organization         Address  Phone   Notes  [facility] Medication [facility] [address] [place] [place], [place] [address] [phone] --Must be a resident of [facility] -- Must have NO insurance coverage whatsoever (no Medicaid/ Medicare, etc.) -- The pt. MUST have a primary care doctor that directs their care regularly and follows them in the community   [facility]  [phone]   [employer]  [phone]    Agencies that provide  inexpensive medical care: Organization         Address  Phone   Notes  [facility]  [phone]   [facility]    [phone]   [facility] [address] [place] [place], [place] [address] [phone]   [facility] [address] [place] [place] [phone]   Planned Parenthood    [phone]   [facility]    [phone]   [facility]  [address], [place] Phone:  [phone], Fax:  [phone] Hours of Operation:  9 am - 6 pm, M-F.  Also accepts Medicaid/Medicare and self-pay.  [facility]  [address], [place] Phone: [phone], Fax: [phone]. Hours of Operation:  8:30 am - 5:30 pm, M-F.  Also accepts Medicaid and self-pay.  [facility] [place] [address] [place] Phone: [phone]   [facility] [address] Natasha Bence [place], [place] [phone], Ext. [phone] Mondays & Thursdays: 7-9 AM.  First 15 patients are seen on a first come, first serve basis.    Medicaid-accepting [facility] Providers:  Organization         Address  Phone   Notes  [facility] [address], [place] [phone] Also accepts self-pay patients.  [facility] [address] Laurell Josephs [place], [place]  [phone]   [facility] [address], [place] [phone]   [facility] [address], [place] [phone]   Renaye Rakers [address], [place]   [phone] Only accepts [place] Access [place] patients after they have their name applied to their card.   Self-Pay (no insurance) in [facility]:  Organization         Address  Phone   Notes  Sickle Cell Patients, [facility] [address] [place], [place] [phone]   [facility] [address] [place], [place] [phone]   [facility] [place]  [address], [place] [phone]   [facility]/Dr. Osei-Bonsu  [address] [place] or [address], [place] [phone] Phone number for both [place] and [place] locations is the same.  [facility] [address] [place] [phone]   [facility] [address], [place] or [address] [phone]   [facility] [address] [place] [phone], phone; [phone], fax Sees patients 1st and 3rd Saturday of every month.  Must not qualify for public or private insurance (i.e. Medicaid, Medicare, [place] Health Choice, Veterans' Benefits)  Household income should be no more than 200% of the poverty level The clinic cannot treat you if you are pregnant or think you are pregnant  Sexually transmitted diseases are not treated at the clinic.    Dental Care: Organization         Address  Phone  Notes  [facility] [address] [place], [place] [phone] Accepts children up to age 69 who are enrolled in [place] or [place] Health Choice; pregnant women with a Medicaid card; and children who have applied for Medicaid or [place] Health Choice, but  were declined, whose parents can pay a reduced fee at time of service.  [facility] [place]  [address], [place] [phone] Accepts children up to age 22 who are enrolled  in [place] or [place] Health Choice; pregnant women with a Medicaid card; and children who have applied for Medicaid or [facility] Health Choice, but were declined, whose parents can pay a reduced fee at time of service.  [facility]  [address], [place] [phone] Patients are seen by appointment only. Walk-ins are not accepted. [facility] will see patients 22 years of age and older. Monday - Tuesday (8am-5pm) Most Wednesdays (8:30-5pm) $30 per visit, cash only  [facility]  [address], [place] [facility] [place] [phone] Patients are seen by appointment only. Walk-ins are not accepted. [facility] will see patients 22 years of age and older. One Wednesday Evening (Monthly: Volunteer Based).  $30 per visit, cash only  [employer] [facility]  [phone] for adults; Children under age 24, call Graduate Pediatric Dentistry at [phone]. Children aged 644-14, please call [phone] to request a pediatric application.  Dental services are provided in all areas of dental care including fillings, crowns and bridges, complete and partial dentures, implants, gum treatment, root canals, and extractions. Preventive care is also provided. Treatment is provided to both adults and children. Patients are selected via a lottery and there is often a waiting list.   [facility] [address], [place]  [phone] [email or web address]   [facility] [address], [place], [place] [phone], Ext. [phone] Second and Fourth Thursday of each month, opens at 6:30 AM; Clinic ends at 9 AM.  Patients are seen on a first-come first-served basis, and a limited number are seen during each clinic.   [facility]  [address], [place], [place] [phone]   Eligibility Requirements You must have lived in [place], [place], or [place] counties for at least the last three months.   You cannot be  eligible for state or federal sponsored [place]healthcare insurance, including CIGNAVeterans Administration, [place]icaid, or [employer]icare.   You generally cannot be eligible for healthcare insurance through your employer.    How to apply: Eligibility screenings are held every Tuesday and Wednesday afternoon from 1:00 pm until 4:00 pm. You do not need an appointment for the interview!  [facility] [address], [place], [place] [phone]   [facility]  [phone]   [facility]  [phone]   [facility]  [phone]    Behavioral Health Resources in the Community: Intensive Outpatient Programs Organization         Address  Phone  Notes  [facility] [address], [place], [place] [phone]   [facility] [address], [place], [place] [phone]   [facility] [address], [place], [place]  [phone]   [facility] [address],  [place], [place] [phone] or [phone]   Substance Abuse Resources Organization         Address  Phone  Notes  [facility]  [phone]   [facility]  [phone]   [facility]  [phone]   [facility]  [phone]   Residential & Outpatient Substance Abuse Program  [phone]   Psychological Services  Organization         Address  Phone  Notes  [employer] [facility]  [phone]   [facility]  [phone]   [facility] [address] [place] [phone] or [phone]    Mobile Crisis Teams Organization         Address  Phone  Notes  [facility], Mobile Crisis Care Unit  [phone]   [facility]  [address] [place], [place] [phone]   Doristine Locks [address] [place] [phone]    Self-Help/Support Groups Organization          Address  Phone             Notes  [facility] of [place] - variety of support groups  [phone]- [phone] Call for more information  Narcotics Anonymous (NA), [facility] [address], [employer] [place]  2 meetings at this location   Statistician         Address  Phone  Notes  [facility] [address],    [place]  [phone]   [facility]  [address], [place] [address], [place], [place] [phone]   [facility] [address] [place], [place] [phone] Admissions: 8am-3pm M-F  [facility] [address]    [place], [place] [phone]   [facility] [address] [place], [place], [place] [phone]   [facility] [address],  [place], [place] [phone]   [facility] - Intensive Outpatient [address]ure[address] Josephs [address], [place], [place] [phone]   [facility] ([facility] [address] [place].,  [place], [place] [phone] or [phone]   [facility] ([facility]) [address], [place], [place] [phone] Accepts Medicaid  [facility] [address],  [place] [phone] Substance Abuse/Addiction Treatment   [facility] Organization         Address  Phone  Notes  [facility]  [phone]   Angie Fava, PhD [address] Ervin Knack [place], [place]   [phone] or [phone]   [facility]   [address] [place], [place] [phone]   [facility] [address], [place], [place] [phone] Insurance/Medicaid/sponsorship through [facility] and Families [address]                                    [place], [place] [phone] Therapy/tele-psych/case  [facility] [address], [place] [phone]    Dr. Lolly Mustache  [phone]   [facility] [place]  [employer]  [facility] Dept. 1) [address] [place] 2) [address], [place] 3)  [address], [place] [phone]  [phone]   [facility] Child Abuse Hotline 929-431-3522 or [phone] (After Hours)

## 2015-01-12 NOTE — ED Notes (Signed)
Ice pack applied to right knee

## 2015-01-12 NOTE — ED Provider Notes (Signed)
CSN: [id number]     Arrival date & time 01/12/15  1331 History   First MD Initiated Contact with Patient 01/12/15 1339     Chief Complaint  Patient presents with  . Knee Pain     (Consider location/radiation/quality/duration/timing/severity/associated sxs/prior Treatment) The history is provided by the patient and medical records. No language interpreter was used.     Brian Lopez is a 24 y.o. male  with a hx of asthma presents to the Emergency Department complaining of gradual, persistent, progressively worsening nontraumatic right knee pain onset 3 days ago.  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 He reports that his pain is worse with bending and walking. He reports taking ibuprofen without relief. He has known falls or trauma. He denies pain in his foot or ankle. He reports some swelling of his knee but no swelling of his leg. He denies pain or discomfort with range of motion of his hip. Pt denies fever, chills, chest pain, shortness of breath, abdominal pain, nausea, vomiting, history of gout, no trauma, numbness, tingling, weakness.  Past Medical History  Diagnosis Date  . Asthma    History reviewed. No pertinent past surgical history. No family history on file. Social History  Substance Use Topics  . Smoking status: Current Every Day Smoker  . Smokeless tobacco: None  . Alcohol Use: No    Review of Systems  Constitutional: Negative for fever and chills.  Gastrointestinal: Negative for nausea and vomiting.  Musculoskeletal: Positive for joint swelling and arthralgias. Negative for back pain, neck pain and neck stiffness.  Skin: Negative for wound.  Neurological: Negative for numbness.  Hematological: Does not bruise/bleed easily.  Psychiatric/Behavioral: The patient is not nervous/anxious.   All other systems reviewed and are negative.     Allergies  Review of patient's allergies indicates no  known allergies.  Home Medications   Prior to Admission medications   Medication Sig Start Date End Date Taking? Authorizing Provider  amoxicillin (AMOXIL) 500 MG capsule Take 1 capsule (500 mg total) by mouth 3 (three) times daily. 06/20/14   Marissa Sciacca, PA-C  ibuprofen (ADVIL,MOTRIN) 800 MG tablet Take 1 tablet (800 mg total) by mouth 3 (three) times daily. 01/12/15   Araiya Tilmon, PA-C  naproxen (NAPROSYN) 375 MG tablet Take 1 tablet (375 mg total) by mouth 2 (two) times daily. 06/20/14   Marissa Sciacca, PA-C   BP 134/81 mmHg  Pulse 88  Temp(Src) 97.4 F (36.3 C) (Oral)  Resp 20  Ht  (1.854 m)  Wt 95.255 kg  BMI 27.71 kg/m2  SpO2 98% Physical Exam  Constitutional: He appears well-developed and well-nourished. No distress.  HENT:  Head: Normocephalic and atraumatic.  Eyes: Conjunctivae are normal.  Neck: Normal range of motion.  Cardiovascular: Normal rate, regular rhythm, normal heart sounds and intact distal pulses.   Capillary refill < 3 sec  Pulmonary/Chest: Effort normal and breath sounds normal.  Musculoskeletal: He exhibits tenderness. He exhibits no edema.  ROM: full extension of the right knee with flexion only to 90 degrees due to pain Limping gait, but able to bear weight TTP of the lateral joint line of the right knee with visible small effusion; TTP of the patellar tendon, but no abnormal patellar motion No foot drop with straight leg raise No erythema or increased warmth  Neurological: He is alert. Coordination normal.  Sensation intact to dull and sharp Strength 5/5 in th RLE  including flexion extension of the right knee and dorsiflexion and plantarflexion  Skin: Skin is warm and dry. He is not diaphoretic. No erythema.  No tenting of the skin  Psychiatric: He has a normal mood and affect.  Nursing note and vitals reviewed.   ED Course  Procedures (including critical care time) Labs Review Labs Reviewed - No data to display  Imaging  Review Dg Knee Complete 4 Views Right  01/12/2015  CLINICAL DATA:  Anterior RIGHT lateral knee pain for 3 days. EXAM: RIGHT KNEE - COMPLETE 4+ VIEW COMPARISON:  None. FINDINGS: No fracture of the proximal tibia or distal femur. Patella is normal. No joint effusion. IMPRESSION: No acute osseous abnormality. Electronically Signed   By: Genevive BiStewart  Edmunds M.D.   On: 01/12/2015 14:22   I have personally reviewed and evaluated these images and lab results as part of my medical decision-making.   EKG Interpretation None      MDM   Final diagnoses:  Arthralgia of right knee  Knee effusion, right   Brian Lopez presents with right knee pain.  Patient X-Ray negative for obvious fracture or dislocation. Pain managed in ED. Pt advised to follow up with orthopedics as needed. Patient given knee brace and ice while in ED, conservative therapy recommended and discussed. Pt declines crutches.  Patient will be dc home & is agreeable with above plan.  BP 134/81 mmHg  Pulse 88  Temp(Src) 97.4 F (36.3 C) (Oral)  Resp 20  Ht 6\' 1"  (1.854 m)  Wt 95.255 kg  BMI 27.71 kg/m2  SpO2 98%    Dierdre ForthHannah Anavey Coombes, PA-C 01/12/15 1500  Tilden FossaElizabeth Rees, MD 01/13/15 [phone]

## 2015-12-24 ENCOUNTER — Emergency Department (HOSPITAL_COMMUNITY)
Admission: EM | Admit: 2015-12-24 | Discharge: 2015-12-24 | Disposition: A | Discharge status: Home / Self Care | Payer: 59 | Attending: Emergency Medicine | Admitting: Emergency Medicine

## 2015-12-24 ENCOUNTER — Encounter (HOSPITAL_COMMUNITY): Payer: Self-pay

## 2015-12-24 DIAGNOSIS — Z79899 Other long term (current) drug therapy: Secondary | ICD-10-CM | POA: Insufficient documentation

## 2015-12-24 DIAGNOSIS — J45909 Unspecified asthma, uncomplicated: Secondary | ICD-10-CM | POA: Diagnosis not present

## 2015-12-24 DIAGNOSIS — F17200 Nicotine dependence, unspecified, uncomplicated: Secondary | ICD-10-CM | POA: Diagnosis not present

## 2015-12-24 DIAGNOSIS — L259 Unspecified contact dermatitis, unspecified cause: Secondary | ICD-10-CM | POA: Diagnosis not present

## 2015-12-24 DIAGNOSIS — R21 Rash and other nonspecific skin eruption: Secondary | ICD-10-CM | POA: Diagnosis present

## 2015-12-24 MED ORDER — DEXAMETHASONE SODIUM PHOSPHATE 10 MG/ML IJ SOLN
10.0000 mg | Freq: Once | INTRAMUSCULAR | Status: AC
  Administered 2015-12-24: 10 mg via INTRAMUSCULAR
  Filled 2015-12-24: qty 1

## 2015-12-24 MED ORDER — PREDNISONE 20 MG PO TABS: 40.0000 mg | ORAL_TABLET | Freq: Every day | ORAL | 0 refills | Status: DC

## 2015-12-24 NOTE — ED Triage Notes (Signed)
Pt states rash to back of hands and upper torso. Pt states ongoing x 3 weeks. 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 Pt denies any SOB or difficulty breathing/swallowing. Pt with lesions to hands. Pt states seen by UC, given topical cream and steroids. States no change. VSS

## 2015-12-24 NOTE — ED Notes (Signed)
D/C papers and follow up treatment reviewed with patient. He verbalizes understanding and intent to follow up

## 2015-12-24 NOTE — ED Provider Notes (Signed)
[facility]-EMERGENCY DEPT Provider Note   CSN: [id number] Arrival date & time: 12/24/15  1657   By signing my name below, I, Brian Lopez, attest that this documentation has been prepared under the direction and in the presence of Brian Horsemanobert Kalah Pflum, [place]. Electronically Signed: Morene CrockerKevin Lopez, Scribe. 12/24/15. 5:29 PM.   History   Chief Complaint Chief Complaint  Patient presents with  . Rash    The history is provided by the patient. No language interpreter was used.    HPI Comments: Brian Lopez is a 25 y.o. male who presents to the Emergency Department complaining of generalized rash of upper body that he describes as burning and itching. He reports the rash began when he started his new job ~ 3 weeks ago. He reports associated tightness in his hands with accompanying pain. He has been using Cortisone 10 and various other OTC meds without relief. He reports recently going to Urgent Care where he was prescribed oral steroids, which he has not yet started. He will pick up the prescription tomorrow. He is scheduled to see dermatologist in December 2017. No SOB, n/v/d.  Past Medical History:  Diagnosis Date  . Asthma     There are no active problems to display for this patient.   History reviewed. No pertinent surgical history.     Home Medications    Prior to Admission medications   Medication Sig Start Date End Date Taking? Authorizing Provider  amoxicillin (AMOXIL) 500 MG capsule Take 1 capsule (500 mg total) by mouth 3 (three) times daily. 06/20/14   Marissa Sciacca, PA-C  ibuprofen (ADVIL,MOTRIN) 800 MG tablet Take 1 tablet (800 mg total) by mouth 3 (three) times daily. 01/12/15   Hannah Muthersbaugh, PA-C  naproxen (NAPROSYN) 375 MG tablet Take 1 tablet (375 mg total) by mouth 2 (two) times daily. 06/20/14   Marissa Sciacca, PA-C    Family History History reviewed. No pertinent family history.  Social History Social History  Substance Use Topics  . Smoking status: Current Every  Day Smoker  . Smokeless tobacco: Never Used  . Alcohol use No     Allergies   Patient has no known allergies.   Review of Systems Review of Systems  Constitutional: Negative for fever.  Skin: Positive for rash.     Physical Exam Updated Vital Signs BP 147/86 (BP Location: Left Arm)   Pulse 82   Temp 97.8 F (36.6 C) (Oral)   Resp 16   SpO2 100%   Physical Exam  Constitutional: He is oriented to person, place, and time. He appears well-developed and well-nourished. No distress.  HENT:  Head: Normocephalic and atraumatic.  Eyes: Conjunctivae are normal.  Cardiovascular: Normal rate.   Pulmonary/Chest: Effort normal.  Abdominal: He exhibits no distension.  Neurological: He is alert and oriented to person, place, and time.  Skin: Skin is warm and dry.  Scattered scaly macular rash on trunk and upper extremities, more severe on bilateral hands without evidence of cellulitis or abscess.  Psychiatric: He has a normal mood and affect.  Nursing note and vitals reviewed.    ED Treatments / Results  DIAGNOSTIC STUDIES: Oxygen Saturation is 100% on RA, normal by my interpretation.    COORDINATION OF CARE: 5:21 PM Discussed treatment plan with pt at bedside and pt agreed to plan.   Labs (all labs ordered are listed, but only abnormal results are displayed) Labs Reviewed - No data to display  EKG  EKG Interpretation None       Radiology  No results found.  Procedures Procedures (including critical care time)  Medications Ordered in ED Medications - No data to display   Initial Impression / Assessment and Plan / ED Course  I have reviewed the triage vital signs and the nursing notes.  Pertinent labs & imaging results that were available during my care of the patient were reviewed by me and considered in my medical decision making (see chart for details).  Clinical Course     Patient with scaly macular rash on upper extremities and trunk. Symptoms started  after being exposed to different types of wood at work. Will treat with Decadron IM and prednisone taper. Recommend dermatology follow-up. Recommend avoiding exposures until cleared by dermatology.  Final Clinical Impressions(s) / ED Diagnoses   Final diagnoses:  Contact dermatitis, unspecified contact dermatitis type, unspecified trigger    New Prescriptions New Prescriptions   No medications on file   I personally performed the services described in this documentation, which was scribed in my presence. The recorded information has been reviewed and is accurate.       Brian Horsemanobert Lochlyn Zullo, PA-C 12/24/15 1744    Brian HongBrian Miller, MD 12/25/15 2059

## 2016-01-16 ENCOUNTER — Emergency Department (HOSPITAL_COMMUNITY)
Admission: EM | Admit: 2016-01-16 | Discharge: 2016-01-16 | Disposition: A | Discharge status: Home / Self Care | Payer: Managed Care, Other (non HMO) | Attending: Emergency Medicine | Admitting: Emergency Medicine

## 2016-01-16 ENCOUNTER — Encounter (HOSPITAL_COMMUNITY): Payer: Self-pay | Admitting: *Deleted

## 2016-01-16 DIAGNOSIS — R21 Rash and other nonspecific skin eruption: Secondary | ICD-10-CM | POA: Insufficient documentation

## 2016-01-16 DIAGNOSIS — J45909 Unspecified asthma, uncomplicated: Secondary | ICD-10-CM | POA: Insufficient documentation

## 2016-01-16 DIAGNOSIS — F17200 Nicotine dependence, unspecified, uncomplicated: Secondary | ICD-10-CM | POA: Insufficient documentation

## 2016-01-16 MED ORDER — HYDROCORTISONE 2.5 % EX LOTN: TOPICAL_LOTION | Freq: Two times a day (BID) | CUTANEOUS | 0 refills | Status: DC

## 2016-01-16 MED ORDER — HYDROXYZINE HCL 25 MG PO TABS: 25.0000 mg | ORAL_TABLET | Freq: Four times a day (QID) | ORAL | 0 refills | Status: DC

## 2016-01-16 NOTE — ED Triage Notes (Signed)
Pt has recent hx of allergic reaction and was treated with steroids. Still has rash to hands and torso, thinks its from the gloves at work. Has appt soon with dermatologist.

## 2016-01-16 NOTE — Discharge Instructions (Signed)
Medications: Hydrocortisone cream, Vistaril  Treatment: Apply hydrocortisone cream twice daily to the affected areas. Take Vistaril as prescribed for itching.  Follow-up: Please follow-up at your scheduled appointment with the dermatologist on Friday. Please return to emergency department if you develop any new or worsening symptoms.

## 2016-01-16 NOTE — ED Provider Notes (Signed)
[facility] Provider Note   CSN: [id number] Arrival date & time: 01/16/16  1115  By signing my name below, I, Brian Lopez, attest that this documentation has been prepared under the direction and in the presence of Buel ReamAlexandra Rennie Hack, PA-C.  Electronically Signed: Placido SouLogan Lopez, ED Scribe. 01/16/16. 12:15 PM.   History   Chief Complaint Chief Complaint  Patient presents with  . Allergic Reaction    HPI HPI Comments: Brian Lopez is a 25 y.o. male with a h/o eczema who presents to the Emergency Department complaining of moderate rash to his hands, arms and upper torso x 3 months. Pt reports associated itchiness across the regions. Pt was seen on 12/24/2015 with a rash to his upper body which began after starting a new job three weeks prior. Pt states he has applied hydrocortisone to the region with some relief and then applied triamcinolone which provided mild relief. He ran out of these however. He states he has switched jobs since the onset of his symptoms and now regularly wears Games developer"mechanic gloves". He is unsure of any new detergents or soaps. He has an appointment with a dermatologist in two days. He denies CP, SOB, fevers, chills, abdominal pain, nausea, vomiting or other associated symptoms at this time.   The history is provided by the patient and medical records. No language interpreter was used.    Past Medical History:  Diagnosis Date  . Asthma     There are no active problems to display for this patient.   History reviewed. No pertinent surgical history.     Home Medications    Prior to Admission medications   Medication Sig Start Date End Date Taking? Authorizing Provider  amoxicillin (AMOXIL) 500 MG capsule Take 1 capsule (500 mg total) by mouth 3 (three) times daily. 06/20/14   Marissa Sciacca, PA-C  hydrocortisone 2.5 % lotion Apply topically 2 (two) times daily. 01/16/16   Emi HolesAlexandra M Rogerio Boutelle, PA-C  hydrOXYzine (ATARAX/VISTARIL) 25 MG tablet Take 1  tablet (25 mg total) by mouth every 6 (six) hours. 01/16/16   Emi HolesAlexandra M Anntonette Madewell, PA-C  ibuprofen (ADVIL,MOTRIN) 800 MG tablet Take 1 tablet (800 mg total) by mouth 3 (three) times daily. 01/12/15   Hannah Muthersbaugh, PA-C  naproxen (NAPROSYN) 375 MG tablet Take 1 tablet (375 mg total) by mouth 2 (two) times daily. 06/20/14   Marissa Sciacca, PA-C  predniSONE (DELTASONE) 20 MG tablet Take 2 tablets (40 mg total) by mouth daily. Take 40 mg by mouth daily for 3 days, then 20mg  by mouth daily for 3 days, then 10mg  daily for 3 days 12/24/15   Roxy Horsemanobert Browning, PA-C    Family History History reviewed. No pertinent family history.  Social History Social History  Substance Use Topics  . Smoking status: Current Every Day Smoker  . Smokeless tobacco: Never Used  . Alcohol use No     Allergies   Patient has no known allergies.   Review of Systems Review of Systems  Constitutional: Negative for chills and fever.  HENT: Negative for facial swelling and sore throat.   Respiratory: Negative for shortness of breath.   Cardiovascular: Negative for chest pain.  Gastrointestinal: Negative for abdominal pain, nausea and vomiting.  Genitourinary: Negative for dysuria.  Musculoskeletal: Negative for back pain.  Skin: Positive for color change and rash. Negative for wound.  Neurological: Negative for headaches.  Psychiatric/Behavioral: The patient is not nervous/anxious.     Physical Exam Updated Vital Signs BP 134/89 (BP Location: Left Arm)  Pulse 64   Temp 98.6 F (37 C) (Oral)   Resp 18   SpO2 99%   Physical Exam  Constitutional: He appears well-developed and well-nourished. No distress.  HENT:  Head: Normocephalic and atraumatic.  Mouth/Throat: Oropharynx is clear and moist. No oropharyngeal exudate.  Eyes: Conjunctivae are normal. Pupils are equal, round, and reactive to light. Right eye exhibits no discharge. Left eye exhibits no discharge. No scleral icterus.  Neck: Normal range of  motion. Neck supple. No thyromegaly present.  Cardiovascular: Normal rate, regular rhythm, normal heart sounds and intact distal pulses.  Exam reveals no gallop and no friction rub.   No murmur heard. Pulmonary/Chest: Effort normal and breath sounds normal. No stridor. No respiratory distress. He has no wheezes. He has no rales.  Abdominal: Soft. Bowel sounds are normal. He exhibits no distension. There is no tenderness. There is no rebound and no guarding.  Musculoskeletal: He exhibits no edema.  Lymphadenopathy:    He has no cervical adenopathy.  Neurological: He is alert. Coordination normal.  Skin: Skin is warm and dry. No rash noted. He is not diaphoretic. No pallor.  Diffuse, scaly rash to bilateral hands, upper extremities, back; no signs of infection or abscess  Psychiatric: He has a normal mood and affect.  Nursing note and vitals reviewed.       ED Treatments / Results  Labs (all labs ordered are listed, but only abnormal results are displayed) Labs Reviewed - No data to display  EKG  EKG Interpretation None       Radiology No results found.  Procedures Procedures  DIAGNOSTIC STUDIES: Oxygen Saturation is 99% on RA, normal by my interpretation.    COORDINATION OF CARE: 12:15 PM Discussed next steps with pt. Pt verbalized understanding and is agreeable with the plan.    Medications Ordered in ED Medications - No data to display  Initial Impression / Assessment and Plan / ED Course  I have reviewed the triage vital signs and the nursing notes.  Pertinent labs & imaging results that were available during my care of the patient were reviewed by me and considered in my medical decision making (see chart for details).  Clinical Course    Patient with rash consistent with eczema or contact dermatitis. No signs of SJS, TEN, infection or abscess. Patient has dermatology appointment in 2 days. We'll discharge patient home with hydrocortisone cream and Vistaril for  itching. Return precautions discussed. Patient understands and agrees with plan. Patient vitals stable throughout ED course and discharged in satisfactory condition.  Final Clinical Impressions(s) / ED Diagnoses   Final diagnoses:  Rash   I personally performed the services described in this documentation, which was scribed in my presence. The recorded information has been reviewed and is accurate.  New Prescriptions Discharge Medication List as of 01/16/2016 12:17 PM    START taking these medications   Details  hydrocortisone 2.5 % lotion Apply topically 2 (two) times daily., Starting Wed 01/16/2016, Print    hydrOXYzine (ATARAX/VISTARIL) 25 MG tablet Take 1 tablet (25 mg total) by mouth every 6 (six) hours., Starting Wed 01/16/2016, Print         Emi Holeslexandra M Eathen Budreau, PA-C 01/16/16 1236    Benjiman CoreNathan Pickering, MD 01/16/16 [phone]

## 2016-01-16 NOTE — ED Notes (Signed)
Pt is in stable condition upon d/c and ambulates from ED. 

## 2016-07-23 ENCOUNTER — Encounter (HOSPITAL_BASED_OUTPATIENT_CLINIC_OR_DEPARTMENT_OTHER): Payer: Self-pay

## 2016-07-23 ENCOUNTER — Emergency Department (HOSPITAL_BASED_OUTPATIENT_CLINIC_OR_DEPARTMENT_OTHER)
Admission: EM | Admit: 2016-07-23 | Discharge: 2016-07-23 | Disposition: A | Discharge status: Home / Self Care | Payer: Managed Care, Other (non HMO) | Attending: Emergency Medicine | Admitting: Emergency Medicine

## 2016-07-23 DIAGNOSIS — F17210 Nicotine dependence, cigarettes, uncomplicated: Secondary | ICD-10-CM | POA: Insufficient documentation

## 2016-07-23 DIAGNOSIS — J45909 Unspecified asthma, uncomplicated: Secondary | ICD-10-CM | POA: Insufficient documentation

## 2016-07-23 DIAGNOSIS — L309 Dermatitis, unspecified: Secondary | ICD-10-CM | POA: Insufficient documentation

## 2016-07-23 HISTORY — DX: Dermatitis, unspecified: L30.9

## 2016-07-23 MED ORDER — DEXAMETHASONE SODIUM PHOSPHATE 10 MG/ML IJ SOLN
10.0000 mg | Freq: Once | INTRAMUSCULAR | Status: AC
  Administered 2016-07-23: 10 mg via INTRAMUSCULAR
  Filled 2016-07-23: qty 1

## 2016-07-23 MED ORDER — TRIAMCINOLONE ACETONIDE 0.1 % EX CREA: 1.0000 "application " | TOPICAL_CREAM | Freq: Two times a day (BID) | CUTANEOUS | 3 refills | Status: AC

## 2016-07-23 MED FILL — TRIAMCINOLONE 0.1% CREAM: 0.1 | 20 days supply | Qty: 160 | Fill #0

## 2016-07-23 NOTE — ED Provider Notes (Signed)
[facility]   CSN: [id number] Arrival date & time: 07/23/16  1219     History   Chief Complaint Chief Complaint  Patient presents with  . Eczema    HPI   Blood pressure (!) 142/67, pulse 81, temperature 98.6 F (37 C), temperature source Oral, resp. rate 18, height 6\' 1"  (1.854 m), weight 96.6 kg (213 lb), SpO2 98 %.  Brian Lopez is a 26 y.o. male complaining of Exacerbation of his chronic eczema to bilateral upper extremities. He's been using over-the-counter medications with little relief he states that he has seen a dermatologist but can't afford the creams prescribed, he does not particularly remember the name of the creams described. He states that the pain is severe and stopping him from sleeping at night.   Past Medical History:  Diagnosis Date  . Asthma   . Eczema     There are no active problems to display for this patient.   History reviewed. No pertinent surgical history.     Home Medications    Prior to Admission medications   Medication Sig Start Date End Date Taking? Authorizing Provider  ibuprofen (ADVIL,MOTRIN) 800 MG tablet Take 1 tablet (800 mg total) by mouth 3 (three) times daily. 01/12/15   Muthersbaugh, Dahlia ClientHannah, PA-C  triamcinolone cream (KENALOG) 0.1 % Apply 1 application topically 2 (two) times daily. 07/23/16   Deaaron Fulghum, Mardella LaymanNicole, PA-C    Family History No family history on file.  Social History Social History  Substance Use Topics  . Smoking status: Current Every Day Smoker    Types: Cigarettes  . Smokeless tobacco: Never Used  . Alcohol use No     Allergies   Patient has no known allergies.   Review of Systems Review of Systems  A complete review of systems was obtained and all systems are negative except as noted in the HPI and PMH.    Physical Exam Updated Vital Signs BP (!) 142/67 (BP Location: Left Arm)   Pulse 81   Temp 98.6 F (37 C) (Oral)   Resp 18   Ht 6\' 1"  (1.854 m)   Wt 96.6  kg (213 lb)   SpO2 98%   BMI 28.10 kg/m   Physical Exam  Constitutional: He is oriented to person, place, and time. He appears well-developed and well-nourished. No distress.  HENT:  Head: Normocephalic and atraumatic.  Mouth/Throat: Oropharynx is clear and moist.  Eyes: Conjunctivae and EOM are normal. Pupils are equal, round, and reactive to light.  Neck: Normal range of motion.  Cardiovascular: Normal rate, regular rhythm and intact distal pulses.   Pulmonary/Chest: Effort normal and breath sounds normal.  Abdominal: Soft. There is no tenderness.  Musculoskeletal: Normal range of motion.  Neurological: He is alert and oriented to person, place, and time.  Skin: Rash noted. He is not diaphoretic.  Severe eczema to bilateral extensor surfaces of the upper extremities with breakdown in the integrity of the skin, no significant warmth, discharge or induration.  Psychiatric: He has a normal mood and affect.  Nursing note and vitals reviewed.    ED Treatments / Results  Labs (all labs ordered are listed, but only abnormal results are displayed) Labs Reviewed - No data to display  EKG  EKG Interpretation None       Radiology No results found.  Procedures Procedures (including critical care time)  Medications Ordered in ED Medications  dexamethasone (DECADRON) injection 10 mg (10 mg Intramuscular Given 07/23/16 1258)  Initial Impression / Assessment and Plan / ED Course  I have reviewed the triage vital signs and the nursing notes.  Pertinent labs & imaging results that were available during my care of the patient were reviewed by me and considered in my medical decision making (see chart for details).    Vitals:   07/23/16 1229  BP: (!) 142/67  Pulse: 81  Resp: 18  Temp: 98.6 F (37 C)  TempSrc: Oral  SpO2: 98%  Weight: 96.6 kg (213 lb)  Height: 6\' 1"  (1.854 m)    Medications  dexamethasone (DECADRON) injection 10 mg (10 mg Intramuscular Given 07/23/16  1258)    Brian Lopez is 26 y.o. male presenting with Severe eczema to upper extremities. I have talked to the pharmacist and have confirmed that triamcinolone should be affordable for this patient, he will be given a dose of Decadron while in the ED. I've advised him how to apply barrier creams right after bathing. Advised him to stay away from harsh soaps and chemicals.  Evaluation does not show pathology that would require ongoing emergent intervention or inpatient treatment. Pt is hemodynamically stable and mentating appropriately. Discussed findings and plan with patient/guardian, who agrees with care plan. All questions answered. Return precautions discussed and outpatient follow up given.      Final Clinical Impressions(s) / ED Diagnoses   Final diagnoses:  None    New Prescriptions Discharge Medication List as of 07/23/2016 12:53 PM    START taking these medications   Details  triamcinolone cream (KENALOG) 0.1 % Apply 1 application topically 2 (two) times daily., Starting Wed 07/23/2016, Print         Onika Gudiel, [place], PA-C 07/23/16 1551    Geoffery Lyons, MD 07/24/16 [phone]

## 2016-07-23 NOTE — ED Triage Notes (Signed)
C/o eczema to bilat UE worse x weeks-NAD-steady gait

## 2016-11-03 ENCOUNTER — Encounter (HOSPITAL_BASED_OUTPATIENT_CLINIC_OR_DEPARTMENT_OTHER): Payer: Self-pay

## 2016-11-03 ENCOUNTER — Emergency Department (HOSPITAL_BASED_OUTPATIENT_CLINIC_OR_DEPARTMENT_OTHER)
Admission: EM | Admit: 2016-11-03 | Discharge: 2016-11-03 | Disposition: A | Discharge status: Home / Self Care | Payer: 59 | Attending: Emergency Medicine | Admitting: Emergency Medicine

## 2016-11-03 DIAGNOSIS — J45909 Unspecified asthma, uncomplicated: Secondary | ICD-10-CM | POA: Insufficient documentation

## 2016-11-03 DIAGNOSIS — J069 Acute upper respiratory infection, unspecified: Secondary | ICD-10-CM

## 2016-11-03 DIAGNOSIS — F17210 Nicotine dependence, cigarettes, uncomplicated: Secondary | ICD-10-CM | POA: Insufficient documentation

## 2016-11-03 DIAGNOSIS — R0981 Nasal congestion: Secondary | ICD-10-CM | POA: Diagnosis present

## 2016-11-03 LAB — RAPID STREP SCREEN (MED CTR MEBANE ONLY): Streptococcus, Group A Screen (Direct): NEGATIVE

## 2016-11-03 MED ORDER — KETOROLAC TROMETHAMINE 30 MG/ML IJ SOLN
30.0000 mg | Freq: Once | INTRAMUSCULAR | Status: AC
  Administered 2016-11-03: 30 mg via INTRAMUSCULAR
  Filled 2016-11-03: qty 1

## 2016-11-03 NOTE — ED Provider Notes (Signed)
[facility] Provider [facility]   CSN: [id number] Arrival date & time: 11/03/16  1129     History   Chief Complaint Chief Complaint  Patient presents with  . Nasal Congestion    HPI Brian Lopez is a 26 y.o. male.  HPI Patient presents with myalgias sore throat nasal congestion and cough for the last 2 days. States his wife has some similar symptoms. History of asthma. States he feels bad all over. No real relief with his inhaler. Pain started as sore throat. No nausea or vomiting. Has had chills. Past Medical History:  Diagnosis Date  . Asthma   . Eczema     There are no active problems to display for this patient.   History reviewed. No pertinent surgical history.     Home Medications    Prior to Admission medications   Medication Sig Start Date End Date Taking? Authorizing Provider  ibuprofen (ADVIL,MOTRIN) 800 MG tablet Take 1 tablet (800 mg total) by mouth 3 (three) times daily. 01/12/15   Muthersbaugh, Dahlia Client, PA-C  triamcinolone cream (KENALOG) 0.1 % Apply 1 application topically 2 (two) times daily. 07/23/16   Pisciotta, Mardella Layman    Family History No family history on file.  Social History Social History  Substance Use Topics  . Smoking status: Current Every Day Smoker    Types: Cigarettes  . Smokeless tobacco: Never Used  . Alcohol use No     Allergies   Patient has no known allergies.   Review of Systems Review of Systems  Constitutional: Positive for appetite change, chills and fatigue.  HENT: Positive for congestion, rhinorrhea and sore throat.   Respiratory: Positive for cough, shortness of breath and wheezing.   Cardiovascular: Negative for chest pain.  Gastrointestinal: Negative for abdominal pain.  Genitourinary: Negative for frequency.  Musculoskeletal: Positive for myalgias.  Skin: Negative for rash.  Neurological: Negative for seizures.  Hematological: Negative for adenopathy.  Psychiatric/Behavioral: Negative for  confusion.     Physical Exam Updated Vital Signs BP (!) 136/98 (BP Location: Right Arm)   Pulse 74   Temp 98.6 F (37 C) (Oral)   Resp 18   Ht  (1.854 m)   Wt 100.7 kg (222 lb)   SpO2 99%   BMI 29.29 kg/m   Physical Exam  Constitutional: He appears well-developed.  HENT:  Head: Atraumatic.  Posterior pharyngeal erythema without exudate. Patient has tissues up both of his nostrils.  Neck: Neck supple.  Cardiovascular: Normal rate.   Pulmonary/Chest: He has no wheezes. He has no rales.  Abdominal: Soft. There is no tenderness.  Musculoskeletal: He exhibits no tenderness.  Neurological: He is alert.  Skin: Skin is warm. Capillary refill takes less than 2 seconds.     ED Treatments / Results  Labs (all labs ordered are listed, but only abnormal results are displayed) Labs Reviewed  RAPID STREP SCREEN (NOT AT [facility])  CULTURE, GROUP A STREP [facility])    EKG  EKG Interpretation None       Radiology No results found.  Procedures Procedures (including critical care time)  Medications Ordered in ED Medications  ketorolac (TORADOL) 30 MG/ML injection 30 mg (30 mg Intramuscular Given 11/03/16 1400)     Initial Impression / Assessment and Plan / ED Course  I have reviewed the triage vital signs and the nursing notes.  Pertinent labs & imaging results that were available during my care of the patient were reviewed by me and considered in my medical decision making (  see chart for details).     Patient with URI symptoms. Sore throat. No fever here. Daughter had some similar symptoms. Lungs are clear. Negative strep. Flu considered but really not been prevalent yet. Well-appearing will discharge home Toradol given for comfort.  Final Clinical Impressions(s) / ED Diagnoses   Final diagnoses:  Acute upper respiratory infection    New Prescriptions New Prescriptions   No medications on file     Benjiman Core, MD 11/03/16 [phone]

## 2016-11-03 NOTE — ED Triage Notes (Signed)
Pt reports generalized body aches, cough, nasal congestion, and sore throat x 2 days. Pt has been taking over the counter medication with no relief. Pt reports asthma and having to take home breathing treatments with no relief. No wheezing noted.

## 2016-11-06 LAB — CULTURE, GROUP A STREP (THRC)

## 2017-07-19 IMAGING — DX DG KNEE COMPLETE 4+V*R*
4 series · 4 of 4 positions shown · non-contrast
Comparison: None.

CLINICAL DATA: Anterior RIGHT lateral knee pain for 3 days.

EXAM:
RIGHT KNEE - COMPLETE 4+ VIEW

[knee ap]
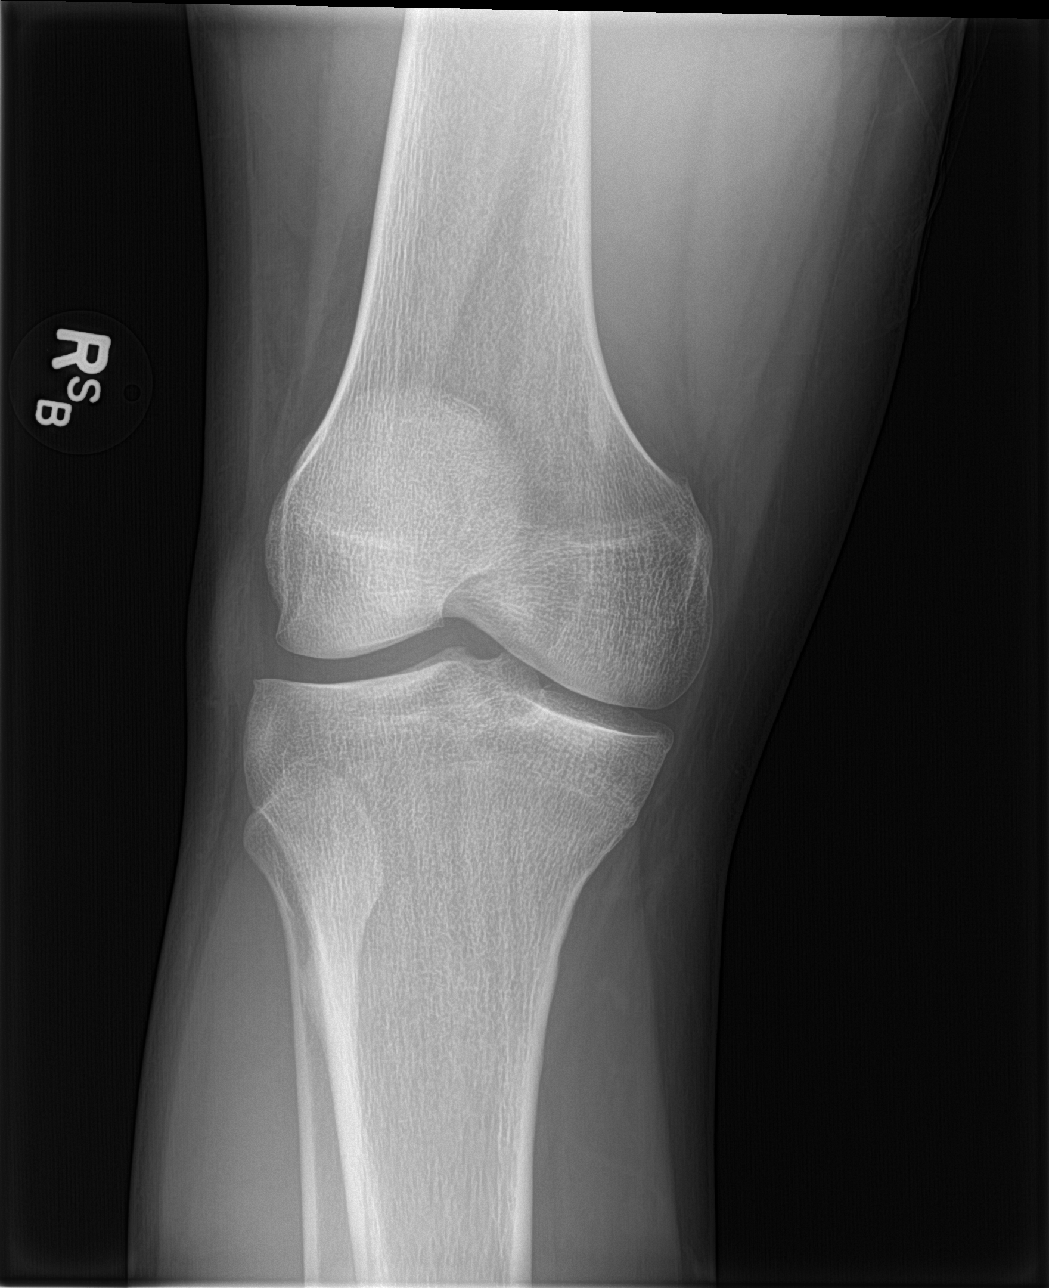

[tunnel]
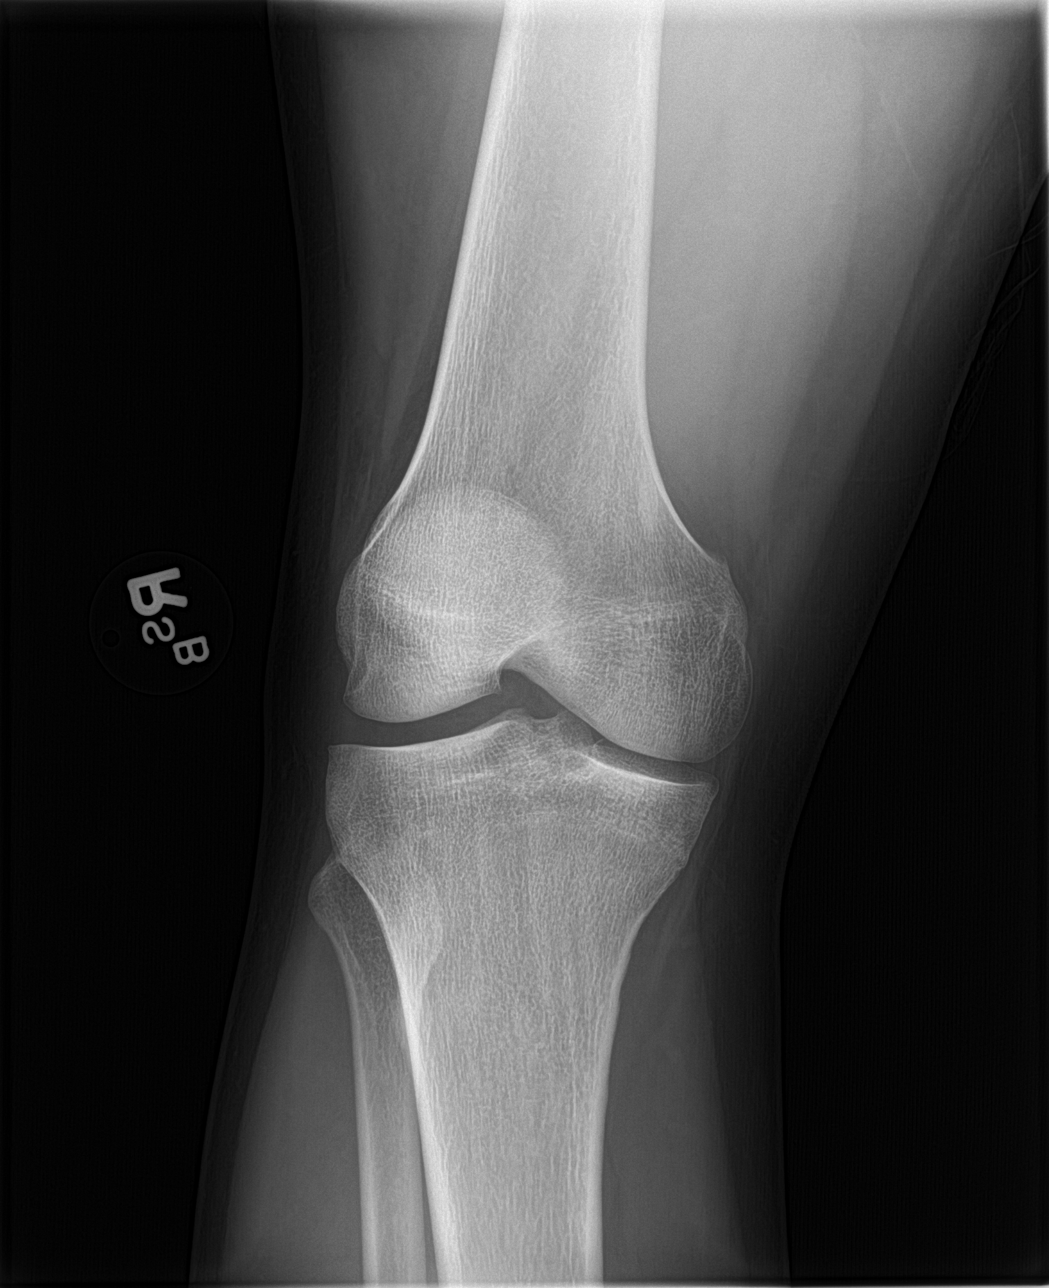

[knee lat]
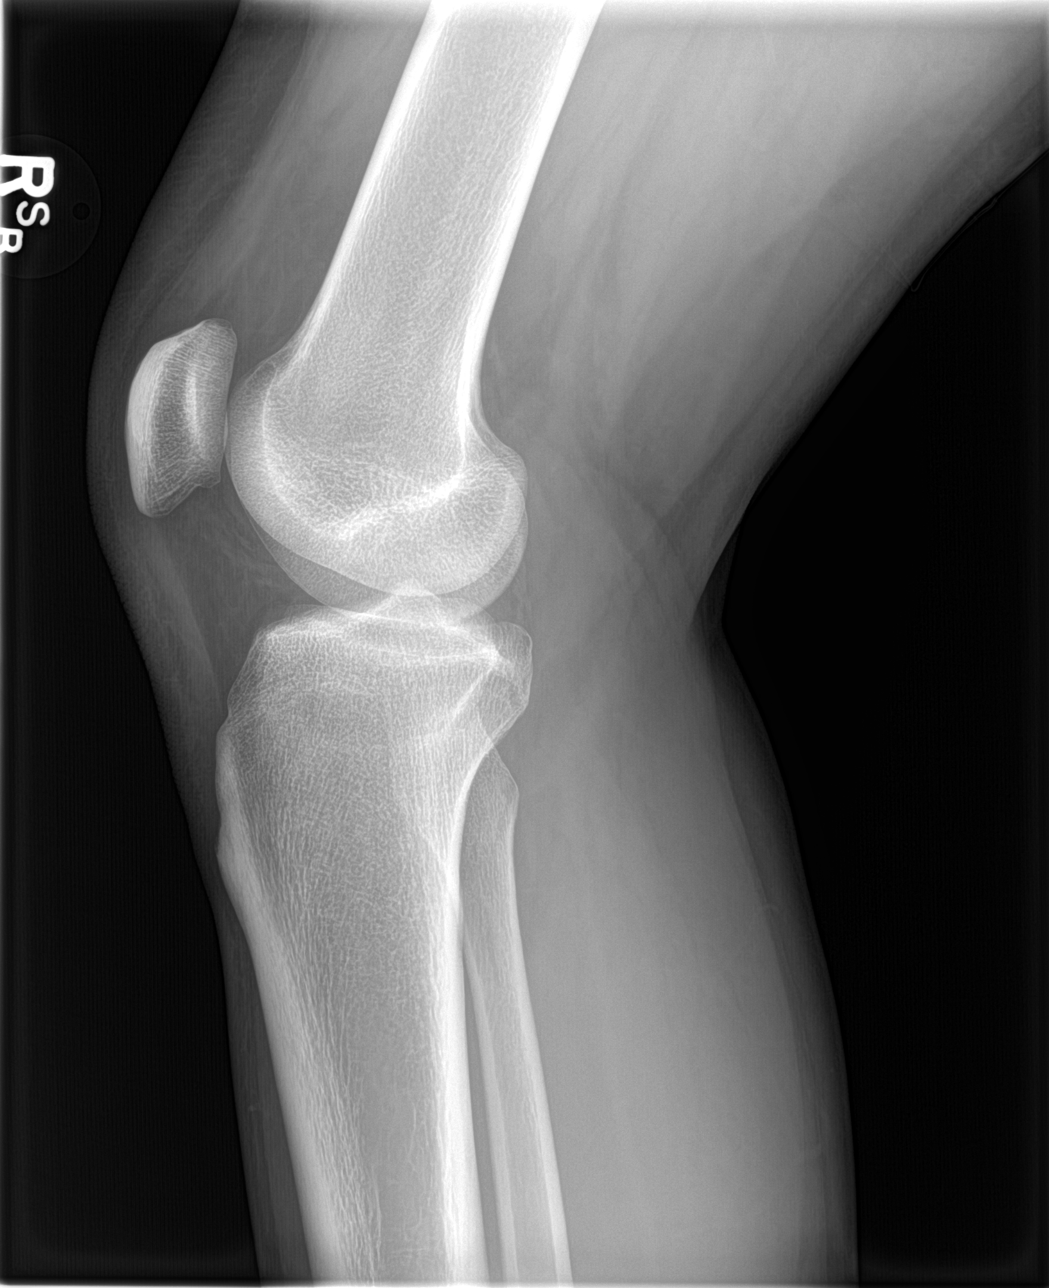

[knee sunrise]
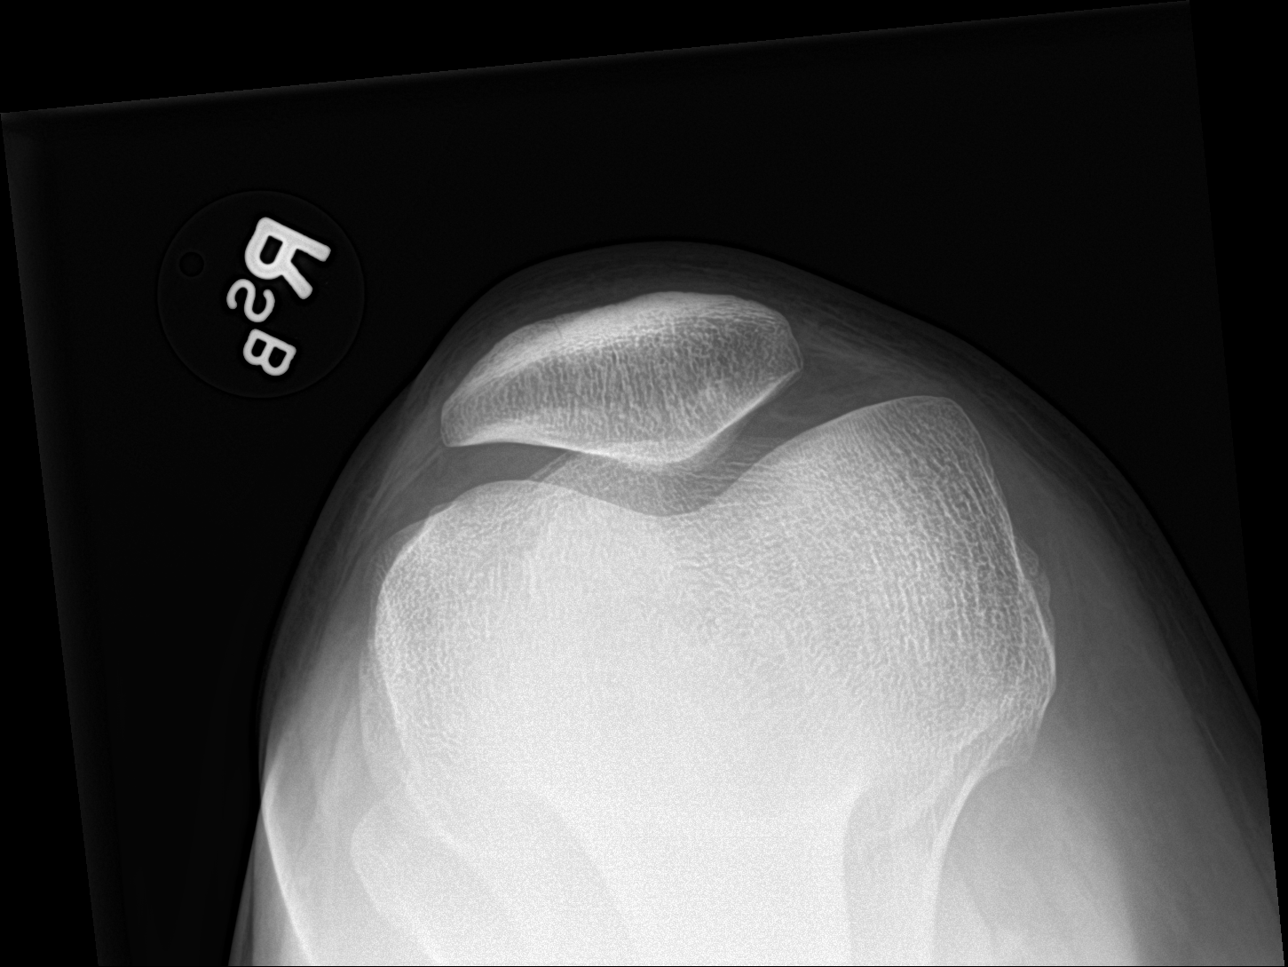

[4 of 4 positions shown; findings below may reference images not displayed]

FINDINGS: No fracture of the proximal tibia or distal femur. Patella is
normal. No joint effusion.
IMPRESSION: No acute osseous abnormality.

## 2018-05-09 ENCOUNTER — Encounter (HOSPITAL_BASED_OUTPATIENT_CLINIC_OR_DEPARTMENT_OTHER): Payer: Self-pay | Admitting: Emergency Medicine

## 2018-05-09 ENCOUNTER — Emergency Department (HOSPITAL_BASED_OUTPATIENT_CLINIC_OR_DEPARTMENT_OTHER)
Admission: EM | Admit: 2018-05-09 | Discharge: 2018-05-09 | Disposition: A | Discharge status: Home / Self Care | Payer: BLUE CROSS/BLUE SHIELD | Attending: Emergency Medicine | Admitting: Emergency Medicine

## 2018-05-09 ENCOUNTER — Other Ambulatory Visit: Payer: Self-pay

## 2018-05-09 DIAGNOSIS — J45909 Unspecified asthma, uncomplicated: Secondary | ICD-10-CM | POA: Insufficient documentation

## 2018-05-09 DIAGNOSIS — K047 Periapical abscess without sinus: Secondary | ICD-10-CM | POA: Diagnosis not present

## 2018-05-09 DIAGNOSIS — F17210 Nicotine dependence, cigarettes, uncomplicated: Secondary | ICD-10-CM | POA: Insufficient documentation

## 2018-05-09 DIAGNOSIS — K0889 Other specified disorders of teeth and supporting structures: Secondary | ICD-10-CM | POA: Diagnosis present

## 2018-05-09 MED ORDER — PENICILLIN V POTASSIUM 500 MG PO TABS: 500.0000 mg | ORAL_TABLET | Freq: Four times a day (QID) | ORAL | 0 refills | Status: AC

## 2018-05-09 NOTE — ED Provider Notes (Signed)
[facility] EMERGENCY DEPARTMENT Provider Note   CSN: [id number] Arrival date & time: 05/09/18  1232    History   Chief Complaint Chief Complaint  Patient presents with  . Dental Pain    HPI Brian Lopez is a 28 y.o. male.     HPI Patient presents with complaint of dental pain.  Patient reports that he has a chipped tooth on the right upper side, and states developed increased pain about a week ago.  He has called his dentist but they are not seeing any patients right now due to coronavirus epidemic.  He states he gradually has developed swelling to the gum.  He has been doing Orajel and mouth rinses with Epsom salts.  He has tried ibuprofen and Tylenol but states they do not help.  There is no facial swelling.  No fever or chills.  No other complaints.  States tooth has been broken for "some time now." States chewing and cold air makes pain worse, nothing makes it better.  No other complaints.   Past Medical History:  Diagnosis Date  . Asthma   . Eczema     There are no active problems to display for this patient.   History reviewed. No pertinent surgical history.      Home Medications    Prior to Admission medications   Medication Sig Start Date End Date Taking? Authorizing Provider  ibuprofen (ADVIL,MOTRIN) 800 MG tablet Take 1 tablet (800 mg total) by mouth 3 (three) times daily. 01/12/15   Muthersbaugh, Dahlia Client, PA-C  penicillin v potassium (VEETID) 500 MG tablet Take 1 tablet (500 mg total) by mouth 4 (four) times daily for 10 days. 05/09/18 05/19/18  Aleisa Howk, Lemont Fillers, PA-C  triamcinolone cream (KENALOG) 0.1 % Apply 1 application topically 2 (two) times daily. 07/23/16   Pisciotta, Mardella Layman    Family History No family history on file.  Social History Social History   Tobacco Use  . Smoking status: Current Every Day Smoker    Types: Cigarettes  . Smokeless tobacco: Never Used  Substance Use Topics  . Alcohol use: No  . Drug use: No      Allergies   Patient has no known allergies.   Review of Systems Review of Systems  Constitutional: Negative for chills.  HENT: Positive for dental problem. Negative for drooling, ear pain and facial swelling.   Skin: Negative for rash.  Neurological: Negative for headaches.     Physical Exam Updated Vital Signs BP 130/82 (BP Location: Right Arm)   Pulse 73   Temp 98.2 F (36.8 C) (Oral)   Resp 18   Ht 6\' 1"  (1.854 m)   Wt 104.3 kg   SpO2 99%   BMI 30.34 kg/m   Physical Exam Vitals signs and nursing note reviewed.  Constitutional:      General: He is not in acute distress.    Appearance: He is well-developed.  HENT:     Head: Normocephalic and atraumatic.     Comments: Partially broken right upper 1st premolar with surrounding gum swelling consistent with small periapical abscess. No trismus. No swelling under the tongue.  Eyes:     Conjunctiva/sclera: Conjunctivae normal.  Neck:     Musculoskeletal: Neck supple.  Cardiovascular:     Rate and Rhythm: Normal rate.  Pulmonary:     Effort: No respiratory distress.  Abdominal:     General: There is no distension.  Skin:    General: Skin is warm and dry.  ED Treatments / Results  Labs (all labs ordered are listed, but only abnormal results are displayed) Labs Reviewed - No data to display  EKG None  Radiology No results found.  Procedures Procedures (including critical care time)  Medications Ordered in ED Medications - No data to display   Initial Impression / Assessment and Plan / ED Course  I have reviewed the triage vital signs and the nursing notes.  Pertinent labs & imaging results that were available during my care of the patient were reviewed by me and considered in my medical decision making (see chart for details).        Patient's exam is consistent with periapical abscess, with no facial swelling, trismus, swelling under the tongue.  He is afebrile, normal vital signs.  Stable  for outpatient treatment.  I will treated with penicillin, continue ibuprofen and Tylenol, continue salt water rinses.  Follow-up with a dentist.  Strict return precautions discussed  Vitals:   05/09/18 1237 05/09/18 1238  BP:  130/82  Pulse:  73  Resp:  18  Temp:  98.2 F (36.8 C)  TempSrc:  Oral  SpO2:  99%  Weight: 104.3 kg   Height: 6\' 1"  (1.854 m)      Final Clinical Impressions(s) / ED Diagnoses   Final diagnoses:  Dental abscess    ED Discharge Orders         Ordered    penicillin v potassium (VEETID) 500 MG tablet  4 times daily     05/09/18 1249           Jaynie Crumble, PA-C 05/09/18 1257    Little, Ambrose Finland, MD 05/09/18 1335

## 2018-05-09 NOTE — Discharge Instructions (Signed)
Continue salt water rinses. Take tylenol or motrin for pain. Antibiotic until all gone. Follow up with your doctor or return as needed.

## 2018-05-09 NOTE — ED Triage Notes (Signed)
R upper dental pain. He has a broken tooth.

## 2019-06-08 ENCOUNTER — Encounter (HOSPITAL_COMMUNITY): Payer: Self-pay

## 2019-06-08 ENCOUNTER — Emergency Department (HOSPITAL_COMMUNITY)
Admission: EM | Admit: 2019-06-08 | Discharge: 2019-06-09 | Disposition: A | Discharge status: Home / Self Care | Payer: BLUE CROSS/BLUE SHIELD | Attending: Emergency Medicine | Admitting: Emergency Medicine

## 2019-06-08 DIAGNOSIS — Z5321 Procedure and treatment not carried out due to patient leaving prior to being seen by health care provider: Secondary | ICD-10-CM | POA: Insufficient documentation

## 2019-06-08 DIAGNOSIS — K0889 Other specified disorders of teeth and supporting structures: Secondary | ICD-10-CM | POA: Diagnosis not present

## 2019-06-08 NOTE — ED Notes (Signed)
No response for room x3 

## 2019-06-08 NOTE — ED Triage Notes (Signed)
Pt reports that he has been having dental pain on the bottom all day and wants it pulled.

## 2020-05-14 ENCOUNTER — Other Ambulatory Visit: Payer: Self-pay

## 2020-05-14 ENCOUNTER — Encounter (HOSPITAL_BASED_OUTPATIENT_CLINIC_OR_DEPARTMENT_OTHER): Payer: Self-pay

## 2020-05-14 ENCOUNTER — Emergency Department (HOSPITAL_BASED_OUTPATIENT_CLINIC_OR_DEPARTMENT_OTHER)
Admission: EM | Admit: 2020-05-14 | Discharge: 2020-05-14 | Disposition: A | Discharge status: Home / Self Care | Payer: 59 | Attending: Emergency Medicine | Admitting: Emergency Medicine

## 2020-05-14 DIAGNOSIS — F17210 Nicotine dependence, cigarettes, uncomplicated: Secondary | ICD-10-CM | POA: Insufficient documentation

## 2020-05-14 DIAGNOSIS — K029 Dental caries, unspecified: Secondary | ICD-10-CM | POA: Insufficient documentation

## 2020-05-14 DIAGNOSIS — J45909 Unspecified asthma, uncomplicated: Secondary | ICD-10-CM | POA: Insufficient documentation

## 2020-05-14 DIAGNOSIS — K0889 Other specified disorders of teeth and supporting structures: Secondary | ICD-10-CM

## 2020-05-14 MED ORDER — PENICILLIN V POTASSIUM 500 MG PO TABS: 500.0000 mg | ORAL_TABLET | Freq: Four times a day (QID) | ORAL | 0 refills | Status: AC

## 2020-05-14 MED ORDER — OXYCODONE-ACETAMINOPHEN 5-325 MG PO TABS
1.0000 | ORAL_TABLET | Freq: Once | ORAL | Status: AC
  Administered 2020-05-14: 1 via ORAL
  Filled 2020-05-14: qty 1

## 2020-05-14 NOTE — Discharge Instructions (Addendum)
I recommend a combination of tylenol and ibuprofen for management of your pain. You can take a low dose of both at the same time. I recommend 500 mg of Tylenol combined with 600 mg of ibuprofen. This is one maximum strength Tylenol and three regular ibuprofen. You can take these 2-3 times for day for your pain. Please try to take these medications with a small amount of food as well to prevent upsetting your stomach.  Please continue to apply Orajel and clove oil to the tooth.  This will help with your pain.  I would also recommend salt water gargles 2-3 times per day.  Below is the contact information for Dr. Mia Creek.  He is a Radiation protection practitioner.  Please give him a call tomorrow morning and schedule an appointment to have your tooth evaluated.  I have also prescribed you a course of penicillin VK.  This is an antibiotic that you need to take 4 times a day for the next 10 days.  Do not stop taking this medication early.  Please complete the full course of this antibiotic.  If your symptoms worsen, please return to the emergency department.  It was a pleasure to meet you.

## 2020-05-14 NOTE — ED Provider Notes (Signed)
[facility] EMERGENCY DEPARTMENT Provider Note   CSN: [id number] Arrival date & time: 05/14/20  2142     History No chief complaint on file.   Brian Lopez is a 30 y.o. male.  HPI Patient is a 30 year old male who presents the emergency department due to dental pain that started 3 days ago.  Patient states it is in the right upper portion of his mouth.  He has a broken tooth in the region.  He states that the tooth is been broken for a long time but has not been evaluated for it due to funds.  No sore throat, fevers, difficulty swallowing, shortness of breath.  No other complaints.  He states he has been applying Orajel and taking Tylenol with minimal relief.    Past Medical History:  Diagnosis Date  . Asthma   . Eczema     There are no problems to display for this patient.   History reviewed. No pertinent surgical history.     No family history on file.  Social History   Tobacco Use  . Smoking status: Current Every Day Smoker    Types: Cigarettes  . Smokeless tobacco: Never Used  Substance Use Topics  . Alcohol use: Yes    Comment: occ  . Drug use: No    Home Medications Prior to Admission medications   Medication Sig Start Date End Date Taking? Authorizing Provider  penicillin v potassium (VEETID) 500 MG tablet Take 1 tablet (500 mg total) by mouth 4 (four) times daily for 10 days. 05/14/20 05/24/20 Yes Placido Sou, PA-C  ibuprofen (ADVIL,MOTRIN) 800 MG tablet Take 1 tablet (800 mg total) by mouth 3 (three) times daily. 01/12/15   Muthersbaugh, Dahlia Client, PA-C  triamcinolone cream (KENALOG) 0.1 % Apply 1 application topically 2 (two) times daily. 07/23/16   Pisciotta, Joni Reining, PA-C    Allergies    Patient has no known allergies.  Review of Systems   Review of Systems  Constitutional: Negative for chills and fever.  HENT: Positive for dental problem. Negative for drooling, sore throat and trouble swallowing.   Respiratory: Negative for shortness  of breath.    Physical Exam Updated Vital Signs BP (!) 173/118   Pulse (!) 57   Temp 97.7 F (36.5 C) (Oral)   Resp 16   Ht 6\' 1"  (1.854 m)   Wt 99.8 kg   SpO2 100%   BMI 29.03 kg/m   Physical Exam Vitals and nursing note reviewed.  Constitutional:      General: He is not in acute distress.    Appearance: He is well-developed.  HENT:     Head: Normocephalic and atraumatic.     Right Ear: External ear normal.     Left Ear: External ear normal.     Mouth/Throat:     Comments: Diffuse dental caries noted.  Broken molar noted in the right upper mouth.  Exquisite tenderness with manipulation of the tooth.  Small amount of erythema noted just superior to the tooth along the gumline.  Uvula midline.  Readily handling secretions.  No hot potato voice. Eyes:     General: No scleral icterus.       Right eye: No discharge.        Left eye: No discharge.     Conjunctiva/sclera: Conjunctivae normal.  Neck:     Trachea: No tracheal deviation.  Cardiovascular:     Rate and Rhythm: Normal rate.  Pulmonary:     Effort: Pulmonary effort is normal. No  respiratory distress.     Breath sounds: No stridor.  Abdominal:     General: There is no distension.  Musculoskeletal:        General: No swelling or deformity.     Cervical back: Neck supple.  Skin:    General: Skin is warm and dry.     Findings: No rash.  Neurological:     Mental Status: He is alert.     Cranial Nerves: Cranial nerve deficit: no gross deficits.     ED Results / Procedures / Treatments   Labs (all labs ordered are listed, but only abnormal results are displayed) Labs Reviewed - No data to display  EKG None  Radiology No results found.  Procedures Procedures   Medications Ordered in ED Medications  oxyCODONE-acetaminophen (PERCOCET/ROXICET) 5-325 MG per tablet 1 tablet (has no administration in time range)    ED Course  I have reviewed the triage vital signs and the nursing notes.  Pertinent labs  & imaging results that were available during my care of the patient were reviewed by me and considered in my medical decision making (see chart for details).    MDM Rules/Calculators/A&P                          Patient is a 30 year old male who presents the emergency department due to dental pain.  History of similar symptoms in the past.  He has a broken tooth in the region.  Physical exam is generally reassuring.  He does have a possible infection developing around the tooth.  Will discharge patient on a 10-day course of penicillin VK.  He was given 1 dose of Percocet here in the emergency department due to his significant pain.  Recommended continued use of Tylenol and ibuprofen at home for management of his pain.  We discussed dosing.  OTC medications such as clove oil and Orajel.  He was given a referral to a local dentist.  Recommended that he call him first thing tomorrow morning.  Feel he is stable for discharge and he is agreeable.  His questions were answered and he was amicable at the time of discharge.  Final Clinical Impression(s) / ED Diagnoses Final diagnoses:  Pain, dental   Rx / DC Orders ED Discharge Orders         Ordered    penicillin v potassium (VEETID) 500 MG tablet  4 times daily        05/14/20 2312           Placido Sou, PA-C 05/14/20 2323    Terald Sleeper, MD 05/15/20 [phone]

## 2020-05-14 NOTE — ED Triage Notes (Signed)
Pt c/o right upper dental pain x 3 days-NAD-steady gait
